# Patient Record
Sex: Female | Born: 1970 | Race: Black or African American | Hispanic: No | Marital: Single | Smoking: Never smoker
Health system: Southern US, Community
[De-identification: ages and names within clinical notes are randomized; demographics above are authoritative.]

## PROBLEM LIST (undated history)

## (undated) HISTORY — PX: ABDOMINAL HYSTERECTOMY: SHX81

---

## 2004-07-17 ENCOUNTER — Emergency Department: Payer: Self-pay | Admitting: Emergency Medicine

## 2004-09-08 ENCOUNTER — Emergency Department: Payer: Self-pay | Admitting: Emergency Medicine

## 2005-05-19 ENCOUNTER — Emergency Department: Payer: Self-pay | Admitting: Emergency Medicine

## 2006-02-25 ENCOUNTER — Emergency Department: Payer: Self-pay | Admitting: General Practice

## 2006-06-21 ENCOUNTER — Emergency Department: Payer: Self-pay | Admitting: Emergency Medicine

## 2006-06-21 ENCOUNTER — Other Ambulatory Visit: Payer: Self-pay

## 2006-09-28 ENCOUNTER — Emergency Department: Payer: Self-pay

## 2006-12-21 ENCOUNTER — Emergency Department: Payer: Self-pay | Admitting: Emergency Medicine

## 2007-09-30 ENCOUNTER — Emergency Department: Payer: Self-pay | Admitting: Emergency Medicine

## 2008-02-19 ENCOUNTER — Emergency Department: Payer: Self-pay

## 2008-04-10 ENCOUNTER — Emergency Department: Payer: Self-pay | Admitting: Emergency Medicine

## 2008-04-11 ENCOUNTER — Emergency Department: Payer: Self-pay | Admitting: Emergency Medicine

## 2011-11-16 ENCOUNTER — Observation Stay: Payer: Self-pay | Admitting: Obstetrics and Gynecology

## 2011-11-16 LAB — COMPREHENSIVE METABOLIC PANEL
Albumin: 3.4 g/dL (ref 3.4–5.0)
Alkaline Phosphatase: 57 U/L (ref 50–136)
BUN: 11 mg/dL (ref 7–18)
Calcium, Total: 8.7 mg/dL (ref 8.5–10.1)
Chloride: 106 mmol/L (ref 98–107)
Co2: 22 mmol/L (ref 21–32)
Creatinine: 0.7 mg/dL (ref 0.60–1.30)
EGFR (African American): >60
EGFR (Non-African Amer.): >60
Osmolality: 279 (ref 275–301)
SGPT (ALT): 18 U/L
Sodium: 140 mmol/L (ref 136–145)

## 2011-11-16 LAB — URINALYSIS, COMPLETE
Bilirubin,UR: NEGATIVE
Glucose,UR: NEGATIVE mg/dL (ref 0–75)
Ketone: NEGATIVE
Ph: 5 (ref 4.5–8.0)
RBC,UR: 11 /HPF (ref 0–5)
Specific Gravity: 1.021 (ref 1.003–1.030)
Squamous Epithelial: 13
WBC UR: 308 /HPF (ref 0–5)

## 2011-11-16 LAB — CBC
HCT: 24.1 % — ABNORMAL LOW (ref 35.0–47.0)
HGB: 7.4 g/dL — ABNORMAL LOW (ref 12.0–16.0)
MCH: 21.4 pg — ABNORMAL LOW (ref 26.0–34.0)
MCV: 69 fL — ABNORMAL LOW (ref 80–100)
Platelet: 281 10*3/uL (ref 150–440)
RDW: 21.3 % — ABNORMAL HIGH (ref 11.5–14.5)
WBC: 7.1 10*3/uL (ref 3.6–11.0)

## 2011-11-16 LAB — CK TOTAL AND CKMB (NOT AT ARMC)
CK, Total: 110 U/L (ref 21–215)
CK-MB: 0.7 ng/mL (ref 0.5–3.6)

## 2011-11-16 LAB — HCG, QUANTITATIVE, PREGNANCY: Beta Hcg, Quant.: <1 m[IU]/mL — ABNORMAL LOW

## 2011-11-16 LAB — WET PREP, GENITAL

## 2011-11-17 LAB — CBC WITH DIFFERENTIAL/PLATELET
Basophil #: 0 10*3/uL (ref 0.0–0.1)
HGB: 7.1 g/dL — ABNORMAL LOW (ref 12.0–16.0)
Lymphocyte %: 21.8 %
MCH: 20.8 pg — ABNORMAL LOW (ref 26.0–34.0)
MCHC: 29.5 g/dL — ABNORMAL LOW (ref 32.0–36.0)
Monocyte #: 0.5 10*3/uL (ref 0.0–0.7)
Monocyte %: 9.4 %
Neutrophil #: 3.9 10*3/uL (ref 1.4–6.5)
Neutrophil %: 66 %
RBC: 3.43 10*6/uL — ABNORMAL LOW (ref 3.80–5.20)
RDW: 19.6 % — ABNORMAL HIGH (ref 11.5–14.5)
WBC: 5.8 10*3/uL (ref 3.6–11.0)

## 2011-11-18 LAB — URINE CULTURE

## 2013-01-05 ENCOUNTER — Emergency Department: Payer: Self-pay | Admitting: Emergency Medicine

## 2013-04-14 ENCOUNTER — Emergency Department: Payer: Self-pay | Admitting: Emergency Medicine

## 2013-04-14 LAB — COMPREHENSIVE METABOLIC PANEL
Albumin: 3.6 g/dL (ref 3.4–5.0)
Alkaline Phosphatase: 86 U/L (ref 50–136)
Anion Gap: 3 — ABNORMAL LOW (ref 7–16)
BUN: 11 mg/dL (ref 7–18)
Bilirubin,Total: 0.5 mg/dL (ref 0.2–1.0)
Calcium, Total: 9.1 mg/dL (ref 8.5–10.1)
Co2: 28 mmol/L (ref 21–32)
EGFR (African American): >60
EGFR (Non-African Amer.): >60
Glucose: 97 mg/dL (ref 65–99)
Osmolality: 277 (ref 275–301)
SGOT(AST): 20 U/L (ref 15–37)
SGPT (ALT): 23 U/L (ref 12–78)
Sodium: 139 mmol/L (ref 136–145)
Total Protein: 8 g/dL (ref 6.4–8.2)

## 2013-04-14 LAB — URINALYSIS, COMPLETE
Ph: 5 (ref 4.5–8.0)
Protein: 30
RBC,UR: 34 /HPF (ref 0–5)
Specific Gravity: 1.032 (ref 1.003–1.030)
WBC UR: 30 /HPF (ref 0–5)

## 2013-04-14 LAB — CBC
HCT: 40.6 % (ref 35.0–47.0)
HGB: 13.7 g/dL (ref 12.0–16.0)
MCH: 30.1 pg (ref 26.0–34.0)
MCHC: 33.8 g/dL (ref 32.0–36.0)
MCV: 89 fL (ref 80–100)
RBC: 4.57 10*6/uL (ref 3.80–5.20)
RDW: 13.4 % (ref 11.5–14.5)
WBC: 9.3 10*3/uL (ref 3.6–11.0)

## 2013-04-14 LAB — LIPASE, BLOOD: Lipase: 103 U/L (ref 73–393)

## 2013-04-17 LAB — URINE CULTURE

## 2013-09-12 ENCOUNTER — Observation Stay: Payer: Self-pay | Admitting: Internal Medicine

## 2013-09-12 LAB — URINALYSIS, COMPLETE
Bilirubin,UR: NEGATIVE
Glucose,UR: NEGATIVE mg/dL (ref 0–75)
Ketone: NEGATIVE
NITRITE: NEGATIVE
Ph: 7 (ref 4.5–8.0)
Protein: NEGATIVE
RBC,UR: 15 /HPF (ref 0–5)
SPECIFIC GRAVITY: 1.013 (ref 1.003–1.030)

## 2013-09-12 LAB — COMPREHENSIVE METABOLIC PANEL
ALK PHOS: 71 U/L
AST: 19 U/L (ref 15–37)
Albumin: 3.3 g/dL — ABNORMAL LOW (ref 3.4–5.0)
Anion Gap: 3 — ABNORMAL LOW (ref 7–16)
BUN: 8 mg/dL (ref 7–18)
Bilirubin,Total: 0.5 mg/dL (ref 0.2–1.0)
Calcium, Total: 8.9 mg/dL (ref 8.5–10.1)
Chloride: 106 mmol/L (ref 98–107)
Co2: 26 mmol/L (ref 21–32)
Creatinine: 0.68 mg/dL (ref 0.60–1.30)
EGFR (African American): >60
EGFR (Non-African Amer.): >60
GLUCOSE: 100 mg/dL — AB (ref 65–99)
Osmolality: 269 (ref 275–301)
Potassium: 3.6 mmol/L (ref 3.5–5.1)
SGPT (ALT): 20 U/L (ref 12–78)
SODIUM: 135 mmol/L — AB (ref 136–145)
Total Protein: 7.7 g/dL (ref 6.4–8.2)

## 2013-09-12 LAB — TROPONIN I: Troponin-I: <0.02 ng/mL

## 2013-09-12 LAB — CBC
HCT: 38.5 % (ref 35.0–47.0)
HGB: 13 g/dL (ref 12.0–16.0)
MCH: 30.3 pg (ref 26.0–34.0)
MCHC: 33.7 g/dL (ref 32.0–36.0)
MCV: 90 fL (ref 80–100)
PLATELETS: 233 10*3/uL (ref 150–440)
RBC: 4.29 10*6/uL (ref 3.80–5.20)
RDW: 13.9 % (ref 11.5–14.5)
WBC: 8 10*3/uL (ref 3.6–11.0)

## 2013-09-12 LAB — CK TOTAL AND CKMB (NOT AT ARMC)
CK, TOTAL: 86 U/L (ref 21–215)
CK, TOTAL: 76 U/L (ref 21–215)
CK, Total: 71 U/L (ref 21–215)
CK-MB: 0.6 ng/mL (ref 0.5–3.6)
CK-MB: <0.5 ng/mL — ABNORMAL LOW (ref 0.5–3.6)

## 2013-09-13 LAB — CBC WITH DIFFERENTIAL/PLATELET
BASOS ABS: 0 10*3/uL (ref 0.0–0.1)
BASOS PCT: 0.4 %
EOS ABS: 0.1 10*3/uL (ref 0.0–0.7)
Eosinophil %: 1.4 %
HCT: 36.4 % (ref 35.0–47.0)
HGB: 12.2 g/dL (ref 12.0–16.0)
LYMPHS PCT: 28.9 %
Lymphocyte #: 2.1 10*3/uL (ref 1.0–3.6)
MCH: 30.1 pg (ref 26.0–34.0)
MCHC: 33.5 g/dL (ref 32.0–36.0)
MCV: 90 fL (ref 80–100)
Monocyte #: 0.7 x10 3/mm (ref 0.2–0.9)
Monocyte %: 9.7 %
NEUTROS PCT: 59.6 %
Neutrophil #: 4.3 10*3/uL (ref 1.4–6.5)
PLATELETS: 207 10*3/uL (ref 150–440)
RBC: 4.05 10*6/uL (ref 3.80–5.20)
RDW: 13.7 % (ref 11.5–14.5)
WBC: 7.2 10*3/uL (ref 3.6–11.0)

## 2013-09-13 LAB — LIPID PANEL
Cholesterol: 132 mg/dL (ref 0–200)
HDL Cholesterol: 58 mg/dL (ref 40–60)
Ldl Cholesterol, Calc: 60 mg/dL (ref 0–100)
Triglycerides: 70 mg/dL (ref 0–200)
VLDL Cholesterol, Calc: 14 mg/dL (ref 5–40)

## 2013-09-13 LAB — BASIC METABOLIC PANEL
Anion Gap: 4 — ABNORMAL LOW (ref 7–16)
BUN: 12 mg/dL (ref 7–18)
CALCIUM: 8.9 mg/dL (ref 8.5–10.1)
Chloride: 108 mmol/L — ABNORMAL HIGH (ref 98–107)
Co2: 27 mmol/L (ref 21–32)
Creatinine: 0.72 mg/dL (ref 0.60–1.30)
EGFR (African American): >60
GLUCOSE: 93 mg/dL (ref 65–99)
OSMOLALITY: 277 (ref 275–301)
POTASSIUM: 3.9 mmol/L (ref 3.5–5.1)
SODIUM: 139 mmol/L (ref 136–145)

## 2013-09-13 LAB — TSH: Thyroid Stimulating Horm: 1.22 u[IU]/mL

## 2013-09-13 LAB — MAGNESIUM: Magnesium: 1.9 mg/dL

## 2013-09-13 LAB — HEMOGLOBIN A1C: Hemoglobin A1C: 6.1 % (ref 4.2–6.3)

## 2014-02-12 ENCOUNTER — Emergency Department: Payer: Self-pay | Admitting: Emergency Medicine

## 2014-02-12 LAB — URINALYSIS, COMPLETE
BILIRUBIN, UR: NEGATIVE
Glucose,UR: NEGATIVE mg/dL (ref 0–75)
KETONE: NEGATIVE
Nitrite: POSITIVE
Ph: 5 (ref 4.5–8.0)
Protein: NEGATIVE
RBC,UR: 14 /HPF (ref 0–5)
SPECIFIC GRAVITY: 1.021 (ref 1.003–1.030)
Squamous Epithelial: 7
WBC UR: 23 /HPF (ref 0–5)

## 2014-02-12 LAB — CBC WITH DIFFERENTIAL/PLATELET
Basophil #: 0.1 10*3/uL (ref 0.0–0.1)
Basophil %: 0.6 %
EOS ABS: 0.1 10*3/uL (ref 0.0–0.7)
EOS PCT: 1 %
HCT: 39.8 % (ref 35.0–47.0)
HGB: 13.3 g/dL (ref 12.0–16.0)
Lymphocyte #: 2.1 10*3/uL (ref 1.0–3.6)
Lymphocyte %: 21.6 %
MCH: 30.4 pg (ref 26.0–34.0)
MCHC: 33.5 g/dL (ref 32.0–36.0)
MCV: 91 fL (ref 80–100)
Monocyte #: 0.9 x10 3/mm (ref 0.2–0.9)
Monocyte %: 9.6 %
NEUTROS ABS: 6.4 10*3/uL (ref 1.4–6.5)
Neutrophil %: 67.2 %
PLATELETS: 221 10*3/uL (ref 150–440)
RBC: 4.39 10*6/uL (ref 3.80–5.20)
RDW: 13.6 % (ref 11.5–14.5)
WBC: 9.5 10*3/uL (ref 3.6–11.0)

## 2014-02-12 LAB — COMPREHENSIVE METABOLIC PANEL
ALK PHOS: 81 U/L
Albumin: 3.4 g/dL (ref 3.4–5.0)
Anion Gap: 9 (ref 7–16)
BUN: 10 mg/dL (ref 7–18)
Bilirubin,Total: 0.3 mg/dL (ref 0.2–1.0)
CREATININE: 0.73 mg/dL (ref 0.60–1.30)
Calcium, Total: 9.2 mg/dL (ref 8.5–10.1)
Chloride: 103 mmol/L (ref 98–107)
Co2: 25 mmol/L (ref 21–32)
EGFR (Non-African Amer.): >60
Glucose: 112 mg/dL — ABNORMAL HIGH (ref 65–99)
OSMOLALITY: 274 (ref 275–301)
Potassium: 3.7 mmol/L (ref 3.5–5.1)
SGOT(AST): 15 U/L (ref 15–37)
SGPT (ALT): 16 U/L (ref 12–78)
Sodium: 137 mmol/L (ref 136–145)
TOTAL PROTEIN: 7.6 g/dL (ref 6.4–8.2)

## 2014-02-14 LAB — URINE CULTURE

## 2014-06-21 ENCOUNTER — Emergency Department: Payer: Self-pay | Admitting: Emergency Medicine

## 2014-07-02 ENCOUNTER — Emergency Department: Payer: Self-pay | Admitting: Emergency Medicine

## 2014-12-20 NOTE — Discharge Summary (Signed)
PATIENT NAME:  Brooke Bishop, Brooke Bishop MR#:  [id number] DATE OF BIRTH:  April 10, 1971  DATE OF ADMISSION:  09/12/2013 DATE OF DISCHARGE:  09/13/2013  ADMISSION DIAGNOSIS: Chest pain.   DISCHARGE DIAGNOSES:  1.  Chest pain.  2.  Asthma, which is stable.  3.  Obesity.   CONSULTATIONS: None.   IMAGING: The patient had a Myoview stress test, which essentially showed no evidence of ischemia.   Troponins are negative.   HOSPITAL COURSE:  This is a very pleasant 44 year old female who presented with chest pain. For further details, please refer to the H and P.  1.  Chest pain: The patient was admitted to telemetry. Her cardiac enzymes were cycled, all of which were negative. She underwent a stress test, which essentially was negative. I suspect her chest pain is actually secondary to a cold with chest congestion that she has had. She has no symptoms of asthma. She was chest pain-free during her hospitalization.  2.  History of asthma: This seems to be stable at this time. No wheezing. The patient is not requiring oxygen.  3.  Obesity: We encourage weight loss as tolerated.   DISCHARGE MEDICATIONS: None.   DISCHARGE ACTIVITY: As tolerated.   DISCHARGE FOLLOWUP: The patient will follow up with her MD.   TIME SPENT: Approximately 35 minutes. The patient is medically stable for discharge.    ____________________________ Abdallah Hern P. Juliene PinaMody, MD spm:jcm D: 09/13/2013 14:02:55 ET T: 09/13/2013 18:06:41 ET JOB#: [id number]  cc: Canisha Issac P. Juliene PinaMody, MD, <Dictator> Janyth ContesSITAL P Tylyn Stankovich MD ELECTRONICALLY SIGNED 09/13/2013 21:56

## 2014-12-20 NOTE — H&P (Signed)
PATIENT NAME:  Brooke Bishop, Brooke Bishop MR#:  [id number] DATE OF BIRTH:  February 14, 1971  DATE OF ADMISSION:  09/12/2013  REFERRING PHYSICIAN: Kandee Keenory R. York CeriseForbach, MD  PRIMARY CARE PHYSICIAN: None.   CHIEF COMPLAINT: Chest pain.   HISTORY OF PRESENT ILLNESS: The patient is a pleasant, obese 44 year old African American female with history of well-controlled asthma, on no medications, who comes in for chest pain. Initially, the pain started on Saturday. It woke her from sleep and was substernal. The pain persisted off and on and then went away. The patient was at work and about 9:00 today had another episode of chest pain associated with numbness and tingling in the fingers, some pain in the left shoulder and the left arm. Again, the pain came and went every couple of minutes and now is more persistent. She felt a little short of breath with the pain as well. She came into the hospital where she was noted to have a negative troponin x 1. She has received 4 baby aspirins. She had no acute ST changes on an EKG but given her history, she will be admitted to rule out acute coronary syndrome, and medicine was consulted for further management and evaluation.   PAST MEDICAL HISTORY: Well-controlled asthma, history of C-section, cholecystectomy, and [facility]orrhagia [facility].   ALLERGIES: No known drug allergies.   FAMILY HISTORY: Hypertension, diabetes, and fibroids. Mom had breast cancer. Dad with lung cancer.   SOCIAL HISTORY: No tobacco. Occasional alcohol. No drug use. Works in [employer].   OUTPATIENT MEDICATIONS: None.  REVIEW OF SYSTEMS:    CONSTITUTIONAL: No fever, fatigue, weakness or weight changes.  EYES: No blurry vision or double vision.  EARS, NOSE, THROAT: No tinnitus or hearing loss. Had cold symptoms about 3 weeks ago, which are resolved.  RESPIRATORY: Mild shortness of breath earlier, now resolved. No shortness of breath now. No painful respirations.  CARDIOVASCULAR:  Chest pain as above. No swelling in the legs. No arrhythmia or CHF. No heart attacks in the past. No palpitations.  GASTROINTESTINAL: No nausea, vomiting, diarrhea, abdominal pain, bloody stools or dark stools.  GENITOURINARY: Denies dysuria or hematuria. Has had a history of UTIs in the past but currently, although she has a dirty urinalysis, denies having any increased frequency, dysuria or hematuria.  HEMATOLOGIC AND LYMPHATIC: Denies anemia or easy bruising. Had a history of anemia in the past.  SKIN: No rashes.  MUSCULOSKELETAL: Denies arthritis or gout.  NEUROLOGIC: Denies focal weakness or numbness.  PSYCHIATRIC: Denies anxiety or depression.   PHYSICAL EXAMINATION: VITAL SIGNS: Temperature on arrival 97.4, pulse rate 86, respiratory rate 17, blood pressure 142/76, O2 sat 100% on oxygen.  GENERAL: The patient is an obese pleasant female, sitting in bed, no obvious distress, talking in full sentences.  HEENT: Normocephalic, atraumatic. Pupils are equal and reactive. Moist mucous membranes.  NECK: Supple. No thyroid tenderness. No cervical lymphadenopathy. No JVD.  CARDIOVASCULAR: S1, S2, irregularly irregular. No murmurs, rubs or gallops.  LUNGS: Clear to auscultation without wheezing, rhonchi or rales.  ABDOMEN: Soft, nontender, obese. Positive bowel sounds in all quadrants.  EXTREMITIES: No pitting edema.  SKIN: No obvious rashes. NEUROLOGIC: Cranial nerves II though XII appear to be grossly intact. Strength 5 out of 5 in all extremities.  PSYCHIATRIC: Awake, alert, oriented x 3, pleasant.   LABORATORY, DIAGNOSTIC AND RADIOLOGICAL DATA: Glucose 100, BUN 8, creatinine 0.68, sodium 135, potassium 3.8, albumin of 3.3, otherwise within normal limits. Troponin negative. CBC within normal limits  with hemoglobin of 13 and platelets of 233. Urinalysis: 2+ blood, 1+ leukocyte esterase, 10 WBC, 3+ bacteria; there are 24 epithelial cells.   EKG: Normal sinus rhythm with sinus arrhythmia. No  acute ST elevations or depressions. Rate 79.   X-ray of the chest: Negative for acute cardiopulmonary disease.   ASSESSMENT AND PLAN: We have a 44 year old female with history of well-controlled asthma and significant menorrhagia, status post cholecystectomy, who presents with chest pain. She has a negative troponin and no significant EKG changes, but given her story, would bring her in for observation and rule out acute coronary syndrome. Would start her on a low-dose beta blocker, nitroglycerin patch, morphine p.r.n., and aspirin starting tomorrow as she already has had aspirin. Would obtain an echocardiogram and rule out acute coronary syndrome with cyclic cardiac markers and risk stratify the patient and check TSH, hemoglobin A1c, and a lipid profile. If she does rule out for acute coronary syndrome, we would obtain a stress test for tomorrow. She has no acute ST changes on the EKG at this point. Would start her on heparin for deep vein thrombosis prophylaxis. Although she has a positive urinalysis, I doubt she is having a urinary tract infection. She did have a dose of ceftriaxone in the ER, which I would not continue as she is asymptomatic and does have a significant amount of epithelial cells, hinting of possible  contaminant specimen. The patient is full code.   TOTAL TIME SPENT: 40 minutes.    ____________________________ Krystal Eaton, MD sa:jcm D: 09/12/2013 14:15:39 ET T: 09/12/2013 14:32:10 ET JOB#: [id number]  cc: Krystal Eaton, MD, <Dictator> Krystal Eaton MD ELECTRONICALLY SIGNED 10/12/2013 10:43

## 2014-12-21 NOTE — H&P (Signed)
    Subjective/Chief Complaint 44 yo who presents to ER with dizziness, she started her menses and it was heavier than normal and she got dizzy with bleeding. She was seen in the ER where her HR was 104 and her HCT was 24.1, US done by the ER was found to show masses in the uterus most likely fibroids.    History of Present Illness pt has had heavy periods for two years and very regular. she has not had a pap in over 13 years and has had 2 csections. she has a fam hx of fibroids. The pt has HTN in the ER and has a fam hx but has not been in to see a doctor so she does not know if she has htn or not. She also has POS nitrites on urine dip and has a UTI    Past History No abnl pap ever. 5 miscarriages and 2 csections. she is not a smoker,   Past Med/Surgical Hx:  Asthma:   Cesarean Section:   Cholecystectomy:   ALLERGIES:  No Known Allergies:   Family and Social History:   Family History Hypertension  Diabetes Mellitus  Cancer  Fibroids    Social History negative tobacco, negative ETOH    Place of Living Home   Review of Systems:   Fever/Chills No    Cough No    Sputum No    Abdominal Pain No    Diarrhea No    Constipation No    Nausea/Vomiting No    SOB/DOE No    Chest Pain No    Dysuria Yes    Medications/Allergies Reviewed Medications/Allergies reviewed   Physical Exam:   GEN WD, obese    HEENT moist oral mucosa    RESP normal resp effort    CARD regular rate    ABD denies tenderness  normal BS    NEURO cranial nerves intact    PSYCH alert, lethargic    Additional Comments pt has tried to get medicaid but a few years ago did not qualify, she is in need of a pcp I will get house doc to come monitor her BP and I will start her on provera top et the bleeding to stop.     Assessment/Admission Diagnosis fibroids and no heALTHcare. needs to be seen by pcp as she has htn.she has uti    Plan I will start IV abx and provera PO and get the house docs to see  her for tx of her HTN.   Electronic Signatures: Adria DevonKlett, Germain Koopmann (MD)  (Signed 20-Mar-13 21:09)  Authored: CHIEF COMPLAINT and HISTORY, PAST MEDICAL/SURGIAL HISTORY, ALLERGIES, FAMILY AND SOCIAL HISTORY, REVIEW OF SYSTEMS, PHYSICAL EXAM, ASSESSMENT AND PLAN   Last Updated: 20-Mar-13 21:09 by Adria DevonKlett, Steele Ledonne (MD)

## 2015-04-27 ENCOUNTER — Emergency Department
Admission: EM | Admit: 2015-04-27 | Discharge: 2015-04-27 | Disposition: A | Discharge status: Home / Self Care | Payer: Self-pay | Attending: Student | Admitting: Student

## 2015-04-27 ENCOUNTER — Encounter: Payer: Self-pay | Admitting: Emergency Medicine

## 2015-04-27 DIAGNOSIS — L089 Local infection of the skin and subcutaneous tissue, unspecified: Secondary | ICD-10-CM | POA: Insufficient documentation

## 2015-04-27 DIAGNOSIS — B9689 Other specified bacterial agents as the cause of diseases classified elsewhere: Secondary | ICD-10-CM

## 2015-04-27 MED ORDER — NAPROXEN 500 MG PO TABS: 500.0000 mg | ORAL_TABLET | Freq: Two times a day (BID) | ORAL | Status: DC

## 2015-04-27 MED ORDER — SULFAMETHOXAZOLE-TRIMETHOPRIM 800-160 MG PO TABS: 1.0000 | ORAL_TABLET | Freq: Two times a day (BID) | ORAL | Status: DC

## 2015-04-27 NOTE — ED Provider Notes (Signed)
[facility] Emergency Department Provider Note  ____________________________________________  Time seen: Approximately 4:09 PM  I have reviewed the triage vital signs and the nursing notes.   HISTORY  Chief Complaint Recurrent Skin Infections    HPI Brooke Bishop is a 44 y.o. female patient complaining of papular lesions on her right breast. Patient stated this been a bug to 3 days. Patient denies any discharge from the areas. Patient rating the pain as a 7/10. She denies any fever or chills. No palliative measures taken for this complaint.   History reviewed. No pertinent past medical history.  There are no active problems to display for this patient.   Past Surgical History  Procedure Laterality Date  . Abdominal hysterectomy      No current outpatient prescriptions on file.  Allergies Review of patient's allergies indicates no known allergies.  No family history on file.  Social History Social History  Substance Use Topics  . Smoking status: Never Smoker   . Smokeless tobacco: None  . Alcohol Use: No    Review of Systems Constitutional: No fever/chills Eyes: No visual changes. ENT: No sore throat. Cardiovascular: Denies chest pain. Respiratory: Denies shortness of breath. Gastrointestinal: No abdominal pain.  No nausea, no vomiting.  No diarrhea.  No constipation. Genitourinary: Negative for dysuria. Musculoskeletal: Negative for back pain. Skin: Skin infection Neurological: Negative for headaches, focal weakness or numbness. Allergic/Immunilogical:  10-point ROS otherwise negative.  ____________________________________________   PHYSICAL EXAM:  VITAL SIGNS: ED Triage Vitals  Enc Vitals Group     BP 04/27/15 1544 116/87 mmHg     Pulse Rate 04/27/15 1544 89     Resp 04/27/15 1544 18     Temp 04/27/15 1544 98.5 F (36.9 C)     Temp Source 04/27/15 1544 Oral     SpO2 04/27/15 1544 100 %     Weight 04/27/15 1544 195 lb  (88.451 kg)     Height 04/27/15 1544 5\' 5"  (1.651 m)     Head Cir --      Peak Flow --      Pain Score 04/27/15 1545 7     Pain Loc --      Pain Edu? --      Excl. in GC? --     Constitutional: Alert and oriented. Well appearing and in no acute distress. Eyes: Conjunctivae are normal. PERRL. EOMI. Head: Atraumatic. Nose: No congestion/rhinnorhea. Mouth/Throat: Mucous membranes are moist.  Oropharynx non-erythematous. Neck: No stridor. No cervical spine tenderness to palpation. Hematological/Lymphatic/Immunilogical: No cervical lymphadenopathy. Cardiovascular: Normal rate, regular rhythm. Grossly normal heart sounds.  Good peripheral circulation. Respiratory: Normal respiratory effort.  No retractions. Lungs CTAB. Gastrointestinal: Soft and nontender. No distention. No abdominal bruits. No CVA tenderness. Musculoskeletal: No lower extremity tenderness nor edema.  No joint effusions. Neurologic:  Normal speech and language. No gross focal neurologic deficits are appreciated. No gait instability. Skin:  Skin is warm, dry and intact. No rash noted. Psychiatric: Mood and affect are normal. Speech and behavior are normal.  ____________________________________________   LABS (all labs ordered are listed, but only abnormal results are displayed)  Labs Reviewed - No data to display ____________________________________________  EKG   ____________________________________________  RADIOLOGY   ____________________________________________   PROCEDURES  Procedure(s) performed: None  Critical Care performed: No  ____________________________________________   INITIAL IMPRESSION / ASSESSMENT AND PLAN / ED COURSE  Pertinent labs & imaging results that were available during my care of the patient were reviewed by me and  considered in my medical decision making (see chart for details).  Skin infection right breast. Advised patient supportive home care. Patient was given a  prescription for Bactrim DS to take as directed. Patient is advised to [facility]. ____________________________________________   FINAL CLINICAL IMPRESSION(S) / ED DIAGNOSES  Final diagnoses:  Bacterial skin infection      Joni Reining, PA-C 04/27/15 1618  Gayla Doss, MD 04/27/15 [phone]

## 2015-04-27 NOTE — ED Notes (Signed)
Pt states she has 2 abscesses under right breast that are painful

## 2015-08-18 ENCOUNTER — Emergency Department
Admission: EM | Admit: 2015-08-18 | Discharge: 2015-08-18 | Disposition: A | Discharge status: Home / Self Care | Payer: No Typology Code available for payment source | Attending: Emergency Medicine | Admitting: Emergency Medicine

## 2015-08-18 ENCOUNTER — Encounter: Payer: Self-pay | Admitting: Medical Oncology

## 2015-08-18 DIAGNOSIS — R51 Headache: Secondary | ICD-10-CM | POA: Diagnosis present

## 2015-08-18 DIAGNOSIS — G44209 Tension-type headache, unspecified, not intractable: Secondary | ICD-10-CM | POA: Diagnosis not present

## 2015-08-18 MED ORDER — KETOROLAC TROMETHAMINE 30 MG/ML IJ SOLN
30.0000 mg | Freq: Once | INTRAMUSCULAR | Status: AC
  Administered 2015-08-18: 30 mg via INTRAMUSCULAR
  Filled 2015-08-18: qty 1

## 2015-08-18 MED ORDER — SUMATRIPTAN SUCCINATE 50 MG PO TABS: ORAL_TABLET | ORAL | Status: DC

## 2015-08-18 NOTE — Discharge Instructions (Signed)
Tension Headache A tension headache is pain, pressure, or aching that is felt over the front and sides of your head. These headaches can last from 30 minutes to several days. HOME CARE Managing Pain  Take over-the-counter and prescription medicines only as told by your doctor.  Lie down in a dark, quiet room when you have a headache.  If directed, apply ice to your head and neck area:  Put ice in a plastic bag.  Place a towel between your skin and the bag.  Leave the ice on for 20 minutes, 2-3 times per day.  Use a heating pad or a hot shower to apply heat to your head and neck area as told by your doctor. Eating and Drinking  Eat meals on a regular schedule.  Do not drink a lot of alcohol.  Do not use a lot of caffeine, or stop using caffeine. General Instructions  Keep all follow-up visits as told by your doctor. This is important.  Keep a journal to find out if certain things bring on headaches. For example, write down:  What you eat and drink.  How much sleep you get.  Any change to your diet or medicines.  Try getting a massage, or doing other things that help you to relax.  Lessen stress.  Sit up straight. Do not tighten (tense) your muscles.  Do not use tobacco products. This includes cigarettes, chewing tobacco, or e-cigarettes. If you need help quitting, ask your doctor.  Exercise regularly as told by your doctor.  Get enough sleep. This may mean 7-9 hours of sleep. GET HELP IF:  Your symptoms are not helped by medicine.  You have a headache that feels different from your usual headache.  You feel sick to your stomach (nauseous) or you throw up (vomit).  You have a fever. GET HELP RIGHT AWAY IF:  Your headache becomes very bad.  You keep throwing up.  You have a stiff neck.  You have trouble seeing.  You have trouble speaking.  You have pain in your eye or ear.  Your muscles are weak or you lose muscle control.  You lose your balance  or you have trouble walking.  You feel like you will pass out (faint) or you pass out.  You have confusion.   This information is not intended to replace advice given to you by your health care provider. Make sure you discuss any questions you have with your health care provider.   Document Released: 11/09/2009 Document Revised: 05/06/2015 Document Reviewed: 12/08/2014 Elsevier Interactive Patient Education 2016 [employer]  

## 2015-08-18 NOTE — ED Notes (Signed)
Pt reports migraine headache x 1 week with nausea and vomiting. Pt reports hx years ago of migraines.

## 2015-08-18 NOTE — ED Provider Notes (Signed)
CSN: [id number]     Arrival date & time 08/18/15  0909 History   First MD Initiated Contact with Patient 08/18/15 1032     Chief Complaint  Patient presents with  . Headache    HPI Comments: 44 year old female presents today complaining of "migraine" headache over the past week. Headache affects bilateral frontal region and she also has pain in her neck. It came on gradually and she has had transient relief with OTC anti-inflammatories. Has had nausea with vomiting. Has not taken anything for the headache today. She is requesting a work note for this evening. Has a history of migraines with similar symptoms.   The history is provided by the patient.    History reviewed. No pertinent past medical history. Past Surgical History  Procedure Laterality Date  . Abdominal hysterectomy     No family history on file. Social History  Substance Use Topics  . Smoking status: Never Smoker   . Smokeless tobacco: None  . Alcohol Use: No   OB History    No data available     Review of Systems  Eyes: Negative for photophobia.  Gastrointestinal: Positive for nausea and vomiting.  Neurological: Positive for headaches. Negative for dizziness.  All other systems reviewed and are negative.     Allergies  Percocet  Home Medications   Prior to Admission medications   Medication Sig Start Date End Date Taking? Authorizing Provider  SUMAtriptan (IMITREX) 50 MG tablet May repeat in 2 hours if headache persists or recurs. Max of 4 pills in one day. 08/18/15   Suan Halter V, PA-C   BP 123/62 mmHg  Pulse 74  Temp(Src) 97.7 F (36.5 C) (Oral)  Resp 18  Ht  (1.651 m)  Wt 86.183 kg  BMI 31.62 kg/m2  SpO2 99% Physical Exam  Constitutional: She is oriented to person, place, and time. Vital signs are normal. She appears well-developed and well-nourished. She is active.  Non-toxic appearance. She does not have a sickly appearance. She does not appear ill.  HENT:  Head: Normocephalic and  atraumatic.  Right Ear: Hearing, tympanic membrane, external ear and ear canal normal.  Left Ear: Hearing, tympanic membrane, external ear and ear canal normal.  Nose: Nose normal.  Mouth/Throat: Uvula is midline, oropharynx is clear and moist and mucous membranes are normal.  Eyes: Conjunctivae and EOM are normal. Pupils are equal, round, and reactive to light.  No photophobia  Neck: Normal range of motion and full passive range of motion without pain. Neck supple. No Brudzinski's sign and no Kernig's sign noted.  Lymphadenopathy:    She has no cervical adenopathy.  Neurological: She is alert and oriented to person, place, and time. She has normal strength. No cranial nerve deficit or sensory deficit. She displays a negative Romberg sign. Coordination and gait normal. GCS eye subscore is 4. GCS verbal subscore is 5. GCS motor subscore is 6.  Skin: Skin is warm and dry.  Psychiatric: She has a normal mood and affect. Her behavior is normal. Judgment and thought content normal.  Nursing note and vitals reviewed.   ED Course  Procedures (including critical care time) Labs Review Labs Reviewed - No data to display  Imaging Review No results found. I have personally reviewed and evaluated these images and lab results as part of my medical decision-making.   EKG Interpretation None      MDM  Toradol  IM given in ER with good relief. No red flag warning symptoms  warranting imaging Imitrex 50mg  given to take as needed for headaches. Establish with a PCP for further care of ongoing headaches. Return for new or worsening symptoms  Final diagnoses:  Tension headache        Christella Scheuermannmma Mileah Hemmer V, PA-C 08/18/15 1215  Emily FilbertJonathan E Williams, MD 08/18/15 [phone]

## 2015-10-13 ENCOUNTER — Emergency Department: Payer: Self-pay

## 2015-10-13 ENCOUNTER — Encounter: Payer: Self-pay | Admitting: Emergency Medicine

## 2015-10-13 ENCOUNTER — Emergency Department
Admission: EM | Admit: 2015-10-13 | Discharge: 2015-10-13 | Disposition: A | Discharge status: Home / Self Care | Payer: Self-pay | Attending: Emergency Medicine | Admitting: Emergency Medicine

## 2015-10-13 DIAGNOSIS — Z79899 Other long term (current) drug therapy: Secondary | ICD-10-CM | POA: Insufficient documentation

## 2015-10-13 DIAGNOSIS — R1110 Vomiting, unspecified: Secondary | ICD-10-CM | POA: Insufficient documentation

## 2015-10-13 DIAGNOSIS — E669 Obesity, unspecified: Secondary | ICD-10-CM | POA: Insufficient documentation

## 2015-10-13 DIAGNOSIS — R1012 Left upper quadrant pain: Secondary | ICD-10-CM | POA: Insufficient documentation

## 2015-10-13 LAB — CBC WITH DIFFERENTIAL/PLATELET
BASOS PCT: 0 %
Basophils Absolute: 0 10*3/uL (ref 0–0.1)
EOS ABS: 0.1 10*3/uL (ref 0–0.7)
EOS PCT: 1 %
HCT: 39.9 % (ref 35.0–47.0)
Hemoglobin: 13.3 g/dL (ref 12.0–16.0)
LYMPHS ABS: 1.7 10*3/uL (ref 1.0–3.6)
Lymphocytes Relative: 28 %
MCH: 29.9 pg (ref 26.0–34.0)
MCHC: 33.2 g/dL (ref 32.0–36.0)
MCV: 90.1 fL (ref 80.0–100.0)
MONOS PCT: 10 %
Monocytes Absolute: 0.6 10*3/uL (ref 0.2–0.9)
Neutro Abs: 3.5 10*3/uL (ref 1.4–6.5)
Neutrophils Relative %: 61 %
PLATELETS: 194 10*3/uL (ref 150–440)
RBC: 4.43 MIL/uL (ref 3.80–5.20)
RDW: 13.8 % (ref 11.5–14.5)
WBC: 5.9 10*3/uL (ref 3.6–11.0)

## 2015-10-13 LAB — URINALYSIS COMPLETE WITH MICROSCOPIC (ARMC ONLY)
BILIRUBIN URINE: NEGATIVE
GLUCOSE, UA: NEGATIVE mg/dL
KETONES UR: NEGATIVE mg/dL
Leukocytes, UA: NEGATIVE
NITRITE: NEGATIVE
PROTEIN: NEGATIVE mg/dL
Specific Gravity, Urine: 1.012 (ref 1.005–1.030)
pH: 6 (ref 5.0–8.0)

## 2015-10-13 LAB — TROPONIN I: Troponin I: <0.03 ng/mL (ref <0.031)

## 2015-10-13 LAB — COMPREHENSIVE METABOLIC PANEL
ALBUMIN: 3.9 g/dL (ref 3.5–5.0)
ALK PHOS: 56 U/L (ref 38–126)
ALT: 16 U/L (ref 14–54)
AST: 20 U/L (ref 15–41)
Anion gap: 6 (ref 5–15)
BILIRUBIN TOTAL: 1 mg/dL (ref 0.3–1.2)
BUN: 10 mg/dL (ref 6–20)
CALCIUM: 9.1 mg/dL (ref 8.9–10.3)
CO2: 25 mmol/L (ref 22–32)
CREATININE: 0.65 mg/dL (ref 0.44–1.00)
Chloride: 107 mmol/L (ref 101–111)
GFR calc non Af Amer: >60 mL/min (ref >60)
GLUCOSE: 87 mg/dL (ref 65–99)
Potassium: 3.6 mmol/L (ref 3.5–5.1)
SODIUM: 138 mmol/L (ref 135–145)
TOTAL PROTEIN: 7.8 g/dL (ref 6.5–8.1)

## 2015-10-13 LAB — LIPASE, BLOOD: Lipase: 19 U/L (ref 11–51)

## 2015-10-13 MED ORDER — ONDANSETRON HCL 4 MG PO TABS: 4.0000 mg | ORAL_TABLET | Freq: Every day | ORAL | Status: DC | PRN

## 2015-10-13 MED ORDER — SODIUM CHLORIDE 0.9 % IV BOLUS (SEPSIS)
1000.0000 mL | Freq: Once | INTRAVENOUS | Status: AC
  Administered 2015-10-13: 1000 mL via INTRAVENOUS

## 2015-10-13 MED ORDER — BARIUM SULFATE 2.1 % PO SUSP
450.0000 mL | ORAL | Status: AC
  Administered 2015-10-13 (×2): 450 mL via ORAL

## 2015-10-13 MED ORDER — PROMETHAZINE HCL 25 MG/ML IJ SOLN
INTRAMUSCULAR | Status: AC
  Administered 2015-10-13: 12.5 mg via INTRAVENOUS
  Filled 2015-10-13: qty 1

## 2015-10-13 MED ORDER — FENTANYL CITRATE (PF) 100 MCG/2ML IJ SOLN
50.0000 ug | Freq: Once | INTRAMUSCULAR | Status: AC
  Administered 2015-10-13: 50 ug via INTRAVENOUS
  Filled 2015-10-13: qty 2

## 2015-10-13 MED ORDER — ONDANSETRON 8 MG PO TBDP
8.0000 mg | ORAL_TABLET | Freq: Once | ORAL | Status: AC
  Administered 2015-10-13: 8 mg via ORAL
  Filled 2015-10-13: qty 1

## 2015-10-13 MED ORDER — PROMETHAZINE HCL 25 MG/ML IJ SOLN
12.5000 mg | Freq: Once | INTRAMUSCULAR | Status: AC
  Administered 2015-10-13: 12.5 mg via INTRAVENOUS

## 2015-10-13 NOTE — Discharge Instructions (Signed)

## 2015-10-13 NOTE — ED Provider Notes (Addendum)
[facility] Emergency Department Provider Note  ____________________________________________   I have reviewed the triage vital signs and the nursing notes.   HISTORY  Chief Complaint Abdominal Pain    HPI Brooke Bishop is a 44 y.o. female who has a history of a hysterectomy in the past, as well as migraines, otherwise no abdominal surgeries or significant medical problems presents today complaining of left-sided abdominal pain and vomiting. Began gradually last night. Denies any fever or chills. Has not had any diarrhea. No chest pain or shortness of breath. Pain is reproducible in the abdominal cavity itself. Nothing makes the pain better nothing makes it worse. She denies any hematemesis or bright red blood per rectum. She did vomit 2. Has not had this pain before. Does not seem any worse with food. Denies headache or cough at this time. She denies flank pain or dysuria.  History reviewed. No pertinent past medical history.  There are no active problems to display for this patient.   Past Surgical History  Procedure Laterality Date  . Abdominal hysterectomy      Current Outpatient Rx  Name  Route  Sig  Dispense  Refill  . SUMAtriptan (IMITREX) 50 MG tablet      May repeat in 2 hours if headache persists or recurs. Max of 4 pills in one day.   4 tablet   0     Allergies Shellfish allergy and Percocet  No family history on file.  Social History Social History  Substance Use Topics  . Smoking status: Never Smoker   . Smokeless tobacco: None  . Alcohol Use: No    Review of Systems Constitutional: No fever/chills Eyes: No visual changes. ENT: No sore throat. No stiff neck no neck pain Cardiovascular: Denies chest pain. Respiratory: Denies shortness of breath. Gastrointestinal:   + vomiting.  No diarrhea.  No constipation. Genitourinary: Negative for dysuria. Musculoskeletal: Negative lower extremity swelling Skin: Negative for  rash. Neurological: Negative for headaches, focal weakness or numbness. 10-point ROS otherwise negative.  ____________________________________________   PHYSICAL EXAM:  VITAL SIGNS: ED Triage Vitals  Enc Vitals Group     BP 10/13/15 0912 118/65 mmHg     Pulse Rate 10/13/15 0912 83     Resp 10/13/15 0910 18     Temp 10/13/15 0910 98.2 F (36.8 C)     Temp Source 10/13/15 0910 Oral     SpO2 10/13/15 0912 100 %     Weight 10/13/15 0910 195 lb (88.451 kg)     Height 10/13/15 0910  (1.651 m)     Head Cir --      Peak Flow --      Pain Score 10/13/15 0911 8     Pain Loc --      Pain Edu? --      Excl. in GC? --     Constitutional: Alert and oriented. Well appearing and in no acute distress. Eyes: Conjunctivae are normal. PERRL. EOMI. Head: Atraumatic. Nose: No congestion/rhinnorhea. Mouth/Throat: Mucous membranes are moist.  Oropharynx non-erythematous. Neck: No stridor.   Nontender with no meningismus Cardiovascular: Normal rate, regular rhythm. Grossly normal heart sounds.  Good peripheral circulation. Respiratory: Normal respiratory effort.  No retractions. Lungs CTAB. Abdominal: Obesity noted, there is left mid to upper abdominal discomfort. There is voluntary guarding but no rebound. Palpation does reproduce the patient's discomfort. Her abdomen is soft. Back:  There is no focal tenderness or step off there is no midline tenderness there are  no lesions noted. there is no CVA tenderness Musculoskeletal: No lower extremity tenderness. No joint effusions, no DVT signs strong distal pulses no edema Neurologic:  Normal speech and language. No gross focal neurologic deficits are appreciated.  Skin:  Skin is warm, dry and intact. No rash noted. Psychiatric: Mood and affect are normal. Speech and behavior are normal.  ____________________________________________   LABS (all labs ordered are listed, but only abnormal results are displayed)  Labs Reviewed  COMPREHENSIVE  METABOLIC PANEL  LIPASE, BLOOD  CBC WITH DIFFERENTIAL/PLATELET  URINALYSIS COMPLETEWITH MICROSCOPIC ([facility] ONLY)   ____________________________________________  EKG  I personally interpreted any EKGs ordered by me or triage Normal sinus rhythm rate 59 bpm no acute ST elevation or acute ST depression normal axis unremarkable EKG ____________________________________________  RADIOLOGY  I reviewed any imaging ordered by me or triage that were performed during my shift ____________________________________________   PROCEDURES  Procedure(s) performed: None  Critical Care performed: None  ____________________________________________   INITIAL IMPRESSION / ASSESSMENT AND PLAN / ED COURSE  Pertinent labs & imaging results that were available during my care of the patient were reviewed by me and considered in my medical decision making (see chart for details).  Patient with very reproducible abdominal pain, consistent with possible diverticular other pathologies. We will obtain imaging, blood work, we'll give the patient IV pain medication and reassess. She states Percocet makes her vomit that she has no other allergic reaction to it. She is also allergic to shellfish we'll discuss with radiology there safety protocols for IV contrast. Given her adiposity hopefully we can avoid IV contrast as needed. Oral may be of use. No evidence of this is referred cardiac discomfort. Patient is very reproducible abdominal pain.  ----------------------------------------- 1:51 PM on 10/13/2015 -----------------------------------------  Patient with air in her bladder. Did discuss with on-call urology. MacDiarmid  They recommend outpatient follow-up. They do not recommend empiric treatment. There is no evidence of fistula there is no evidence of ongoing kidney stone there is no evidence of hydronephrosis there is no evidence of UTI on her urine but they do advise following a culture which we will do.  As a precaution we also obtain an EKG given her left-sided pain units is quite reproducible abdominal pain. EKG is normal without any evidence of acute ischemia in any event. And troponin is negative too. Patient is requesting discharge and a work note at this time we will discharge her with outpatient follow-up.     ____________________________________________   FINAL CLINICAL IMPRESSION(S) / ED DIAGNOSES  Final diagnoses:  None      This chart was dictated using voice recognition software.  Despite best efforts to proofread,  errors can occur which can change meaning.     Jeanmarie Plant, MD 10/13/15 1610  Jeanmarie Plant, MD 10/13/15 1352  Jeanmarie Plant, MD 10/13/15 [phone]

## 2015-10-13 NOTE — ED Notes (Signed)
Discussed discharge instructions, prescriptions, and follow-up care with patient. No questions or concerns at this time. Pt stable at discharge.  

## 2015-10-13 NOTE — ED Notes (Signed)
Pt here with c/o LUQ pain and vomiting that began yesterday. Appears in no distress.

## 2015-10-13 NOTE — ED Notes (Addendum)
Pt has completed oral contrast. CT notified.

## 2015-10-13 NOTE — ED Notes (Signed)
Patient transported to CT 

## 2015-10-13 NOTE — ED Notes (Signed)
Pt returned from CT °

## 2015-10-16 LAB — URINE CULTURE: Culture: 60000

## 2016-06-03 ENCOUNTER — Emergency Department
Admission: EM | Admit: 2016-06-03 | Discharge: 2016-06-03 | Disposition: A | Discharge status: Home / Self Care | Payer: No Typology Code available for payment source | Attending: Emergency Medicine | Admitting: Emergency Medicine

## 2016-06-03 ENCOUNTER — Encounter: Payer: Self-pay | Admitting: Emergency Medicine

## 2016-06-03 DIAGNOSIS — H1032 Unspecified acute conjunctivitis, left eye: Secondary | ICD-10-CM

## 2016-06-03 MED ORDER — OFLOXACIN 0.3 % OP SOLN: 2.0000 [drp] | Freq: Four times a day (QID) | OPHTHALMIC | 0 refills | Status: AC

## 2016-06-03 NOTE — ED Triage Notes (Signed)
States she developed left eye swelling and irritation yesterday  Had drainage to left eye

## 2016-06-03 NOTE — ED Provider Notes (Signed)
[facility] Emergency Department Provider Note  ____________________________________________  Time seen: Approximately 10:15 AM  I have reviewed the triage vital signs and the nursing notes.   HISTORY  Chief Complaint Eye Drainage    HPI Brooke Bishop is a 45 y.o. female who presents with redness and drainage from her left eye x2 days. Watery discharge from left eye and eye redness began yesterday, this morning when she woke up she noticed that her lashes were matted shut and the eyelids were a little swollen as well. Denies blurred vision, eye pain, itching, trauma to the eye. Patient does not wear contacts. States her kids all had pink eye about 2 weeks ago, but are better now. Denies fever, congestion, cough, sore throat.    History reviewed. No pertinent past medical history.  There are no active problems to display for this patient.   Past Surgical History:  Procedure Laterality Date  . ABDOMINAL HYSTERECTOMY      Prior to Admission medications   Medication Sig Start Date End Date Taking? Authorizing Provider  ofloxacin (OCUFLOX) 0.3 % ophthalmic solution Place 2 drops into the left eye 4 (four) times daily. 06/03/16 06/10/16  Christiane HaJonathan D Kendon Sedeno, PA-C  ondansetron (ZOFRAN) 4 MG tablet Take 1 tablet (4 mg total) by mouth daily as needed for nausea or vomiting. 10/13/15   Jeanmarie PlantJames A McShane, MD  SUMAtriptan (IMITREX) 50 MG tablet May repeat in 2 hours if headache persists or recurs. Max of 4 pills in one day. 08/18/15   Christella ScheuermannEmma V Lawrence, PA-C    Allergies Shellfish allergy and Percocet [oxycodone-acetaminophen]  No family history on file.  Social History Social History  Substance Use Topics  . Smoking status: Never Smoker  . Smokeless tobacco: Never Used  . Alcohol use No     Review of Systems  Constitutional: No fever/chills Eyes: No visual changes. No discharge ENT: No upper respiratory complaints. Respiratory: no cough.  Neurological:  Negative for headaches.  ____________________________________________   PHYSICAL EXAM:  VITAL SIGNS: ED Triage Vitals  Enc Vitals Group     BP 06/03/16 0935 122/67     Pulse Rate 06/03/16 0935 75     Resp 06/03/16 0935 16     Temp 06/03/16 0935 98.1 F (36.7 C)     Temp src --      SpO2 06/03/16 0935 99 %     Weight 06/03/16 0936 198 lb (89.8 kg)     Height 06/03/16 0936 5\' 5"  (1.651 m)     Head Circumference --      Peak Flow --      Pain Score 06/03/16 0948 2     Pain Loc --      Pain Edu? --      Excl. in GC? --      Constitutional: Alert and oriented. Well appearing and in no acute distress. Eyes: Left conjunctiva injected and mildly edematous, right conjunctiva normal. Left sclera injected, no icterus bilaterally. Clear drainage noted to left eye.  PERRL. EOMI. Head: Atraumatic. ENT:      Nose: No congestion/rhinnorhea.      Mouth/Throat: Mucous membranes are moist. Oropharynx non-erythematous and without exudate. Uvula is midline and non-edematous.  Neck: No stridor. Supple, full ROM without pain or difficulty.  Hematological/Lymphatic/Immunilogical: No cervical lymphadenopathy. Cardiovascular: Normal rate, regular rhythm. Normal S1 and S2.  Good peripheral circulation. Respiratory: Normal respiratory effort without tachypnea or retractions.  Musculoskeletal: Full range of motion to all extremities. No gross deformities appreciated. Neurologic:  Normal speech and language. No gross focal neurologic deficits are appreciated.  Skin:  Skin is warm, dry and intact. No rash noted. Psychiatric: Mood and affect are normal. Speech and behavior are normal. Patient exhibits appropriate insight and judgement.   ____________________________________________   LABS (all labs ordered are listed, but only abnormal results are displayed)  Labs Reviewed - No data to  display ____________________________________________  EKG  None. ____________________________________________  RADIOLOGY  No results found.  ____________________________________________    PROCEDURES  Procedure(s) performed:    Procedures    Medications - No data to display   ____________________________________________   INITIAL IMPRESSION / ASSESSMENT AND PLAN / ED COURSE  Pertinent labs & imaging results that were available during my care of the patient were reviewed by me and considered in my medical decision making (see chart for details).  Review of the [place] CSRS was performed in accordance of the [facility] prior to dispensing any controlled drugs.  Clinical Course    Patient's diagnosis is consistent with conjunctivitis. Patient will be discharged home with prescriptions for ofloxacin opthalmic drops. Patient is to follow up with ophthalmology as needed or otherwise directed. Patient is given ED precautions to return to the ED for any worsening or new symptoms. No other emergency medicine complaints at this time.     ____________________________________________  FINAL CLINICAL IMPRESSION(S) / ED DIAGNOSES  Final diagnoses:  Acute conjunctivitis of left eye, unspecified acute conjunctivitis type      NEW MEDICATIONS STARTED DURING THIS VISIT:  New Prescriptions   OFLOXACIN (OCUFLOX) 0.3 % OPHTHALMIC SOLUTION    Place 2 drops into the left eye 4 (four) times daily.        This chart was dictated using voice recognition software/Dragon. Despite best efforts to proofread, errors can occur which can change the meaning. Any change was purely unintentional.   Racheal Patches, PA-C 06/03/16 1040    Governor Rooks, MD 06/03/16 1359

## 2016-06-05 ENCOUNTER — Emergency Department
Admission: EM | Admit: 2016-06-05 | Discharge: 2016-06-05 | Disposition: A | Discharge status: Home / Self Care | Payer: No Typology Code available for payment source | Attending: Emergency Medicine | Admitting: Emergency Medicine

## 2016-06-05 ENCOUNTER — Encounter: Payer: Self-pay | Admitting: Emergency Medicine

## 2016-06-05 DIAGNOSIS — H1032 Unspecified acute conjunctivitis, left eye: Secondary | ICD-10-CM | POA: Insufficient documentation

## 2016-06-05 MED ORDER — ACETAMINOPHEN-CODEINE #3 300-30 MG PO TABS: 1.0000 | ORAL_TABLET | Freq: Four times a day (QID) | ORAL | 0 refills | Status: DC | PRN

## 2016-06-05 MED ORDER — TETRACAINE HCL 0.5 % OP SOLN
1.0000 [drp] | Freq: Once | OPHTHALMIC | Status: AC
  Administered 2016-06-05: 2 [drp] via OPHTHALMIC
  Filled 2016-06-05: qty 2

## 2016-06-05 MED ORDER — FLUORESCEIN SODIUM 1 MG OP STRP
1.0000 | ORAL_STRIP | Freq: Once | OPHTHALMIC | Status: AC
  Administered 2016-06-05: 1 via OPHTHALMIC
  Filled 2016-06-05: qty 1

## 2016-06-05 NOTE — ED Provider Notes (Signed)
[facility] Emergency Department Provider Note   ____________________________________________   First MD Initiated Contact with Patient 06/05/16 [phone]     (approximate)  I have reviewed the triage vital signs and the nursing notes.   HISTORY  Chief Complaint Conjunctivitis and Headache    HPI Brooke Bishop is a 45 y.o. female reports no significant medical history. Children in the family had "pinkeye" about a week ago and then on Thursday she began to develop itching and redness in her left eye with clear drainage. On Friday she started ciprofloxacin eyedrops which she has been using 4 times daily, she reports since then the left eye has become increasingly red, there is slight amount of swelling around the lids of the eye, and that the left eye has become moderately painful. Patient reports light makes pain worse. Patient also reports having a slight to moderate throbbing left-sided headache. No fevers or chills. No neck pain or stiffness.  Patient reports that she can see clearly out of the eye, there has been no decrease or fuzziness in her vision. Continues to experience drainage from the eye. She does not wear contacts. She has never had trouble with her eye before.  She has been taking Tylenol for headache at home which only provided mild relief.  The right eye has remained normal. She has not seen any redness on her face.  History reviewed. No pertinent past medical history.  There are no active problems to display for this patient.   Past Surgical History:  Procedure Laterality Date  . ABDOMINAL HYSTERECTOMY      Prior to Admission medications   Medication Sig Start Date End Date Taking? Authorizing Provider  acetaminophen-codeine (TYLENOL #3) 300-30 MG tablet Take 1 tablet by mouth every 6 (six) hours as needed for moderate pain. 06/05/16   Sharyn CreamerMark Quale, MD  ofloxacin (OCUFLOX) 0.3 % ophthalmic solution Place 2 drops into the left eye 4 (four)  times daily. 06/03/16 06/10/16  Christiane HaJonathan D Cuthriell, PA-C  ondansetron (ZOFRAN) 4 MG tablet Take 1 tablet (4 mg total) by mouth daily as needed for nausea or vomiting. 10/13/15   Jeanmarie PlantJames A McShane, MD  SUMAtriptan (IMITREX) 50 MG tablet May repeat in 2 hours if headache persists or recurs. Max of 4 pills in one day. 08/18/15   Christella ScheuermannEmma V Lawrence, PA-C    Allergies Shellfish allergy and Percocet [oxycodone-acetaminophen]  No family history on file.  Social History Social History  Substance Use Topics  . Smoking status: Never Smoker  . Smokeless tobacco: Never Used  . Alcohol use No    Review of Systems Constitutional: No fever/chills Eyes: No visual changes.See history of present illness. Patient denies any trauma to the eye. 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 ENT: No sore throat. No trouble breathing or swallowing. Cardiovascular: Denies chest pain. Respiratory: Denies shortness of breath. No cough Gastrointestinal: No abdominal pain.  No nausea, no vomiting.  No diarrhea.  No constipation. Musculoskeletal: Negative for neck pain. Skin: Negative for rash. Neurological: Negative for headaches, focal weakness or numbness.  10-point ROS otherwise negative.  ____________________________________________   PHYSICAL EXAM:  VITAL SIGNS: ED Triage Vitals  Enc Vitals Group     BP 06/05/16 0629 139/69     Pulse Rate 06/05/16 0629 80     Resp 06/05/16 0629 18     Temp 06/05/16 0629 98.1 F (36.7 C)     Temp Source 06/05/16 0629 Oral  SpO2 06/05/16 0629 96 %     Weight 06/05/16 0625 198 lb (89.8 kg)     Height 06/05/16 0625 5\' 5"  (1.651 m)     Head Circumference --      Peak Flow --      Pain Score 06/05/16 0625 10     Pain Loc --      Pain Edu? --      Excl. in GC? --     Constitutional: Alert and oriented. Well appearing and in no acute distress.Very pleasant. Lights off in the room. Eyes:   The right eye appears normal to visual exam.  The conjunctiva and sclera are normal. There is no pain in the right eye. No swelling or erythema surrounding. Normal right eye exam.  The left eye demonstrates mild edema of the eyelids, there is no periorbital swelling, there is no erythema overlying the orbit or evidence to support periorbital cellulitis or orbital cellulitis by clinical exam. The left eye conjunctiva is moderately injected, red, and slightly edematous. The pupil itself appears normal. The sclera is without jaundice or yellowing. The patient reports pain in the left eye with extraocular movements. Visual acuity in both eyes including the left isolated is normal.  Ophthalmologic exam under fluorescein staining with slit lamp: Demonstrates conjunctival inflammation, no flare cells, no hypopyon, there is no uptake of fluorescein in the conjunctiva or evidence of ulceration. No foreign body noted.  Intraocular pressures measure 14, 15 in the right eye. Intraocular pressure on the left measures 15, 16. Normal.  Pupils are equal round and reactive to light. Accommodation normal.  Head: Atraumatic. Nose: No congestion/rhinnorhea. Mouth/Throat: Mucous membranes are moist.  Oropharynx non-erythematous. Neck: No stridor.  No meningismus Cardiovascular: Normal rate, regular rhythm. Grossly normal heart sounds.  Good peripheral circulation. Respiratory: Normal respiratory effort.  No retractions. Lungs CTAB. Musculoskeletal: No lower extremity tenderness nor edema.  No joint effusions. Neurologic:  Normal speech and language. No gross focal neurologic deficits are appreciated. No gait instability. Normal cranial nerve exam. Skin:  Skin is warm, dry and intact. No rash noted. Psychiatric: Mood and affect are normal. Speech and behavior are normal.  ____________________________________________   LABS (all labs ordered are listed, but only abnormal results are displayed)  Labs Reviewed - No data to  display ____________________________________________  EKG   ____________________________________________  RADIOLOGY   ____________________________________________   PROCEDURES  Procedure(s) performed: None  Procedures  Critical Care performed: No  ____________________________________________   INITIAL IMPRESSION / ASSESSMENT AND PLAN / ED COURSE  Pertinent labs & imaging results that were available during my care of the patient were reviewed by me and considered in my medical decision making (see chart for details).  Patient presents with 3 days of painful left eyeball with conjunctival injection and mild edema of the eyelids. No systemic symptoms. No loss of vision. No foreign body sensations. Eye examination itself demonstrates primarily conjunctival injection without clear evidence of other concerning etiology. No neurologic deficits. No evidence of involvement of the optic nerve based on clinical examination and history.  This patient has been treated with 2 days of Cipro otic drops, but is not seeing improvement in symptoms I did discuss the case with Dr. Frederik Schmidt of ophthalmology and arranged follow-up with the patient tomorrow between 8 and 10 AM. Patient agreeable with this plan and will go to see ophthalmology tomorrow morning for follow-up. Ophthalmology recommends adding artificial tears to help with hydrating the eye, but no additional antibiotic.  Patient does report  she can take Tylenol 3, I will give her a prescription for this. She'll be followed closely by ophthalmology. We discussed specific return precautions including immediate return if she is to start noticing loss of vision, difficulty seeing out of the eye, stiff neck or severe pain in the left eye, increased redness or swelling, double vision, or other new concerns arise she'll return to emergency room right away.  I will prescribe the patient a narcotic pain medicine due to their condition which I  anticipate will cause at least moderate pain short term. I discussed with the patient safe use of narcotic pain medicines, and that they are not to drive, work in dangerous areas, or ever take more than prescribed (no more than 1 pill every 6 hours). We discussed that this is the type of medication that can be  overdosed on and the risks of this type of medicine. Patient is very agreeable to only use as prescribed and to never use more than prescribed.    Clinical Course     ____________________________________________   FINAL CLINICAL IMPRESSION(S) / ED DIAGNOSES  Final diagnoses:  Acute conjunctivitis of left eye, unspecified acute conjunctivitis type      NEW MEDICATIONS STARTED DURING THIS VISIT:  New Prescriptions   ACETAMINOPHEN-CODEINE (TYLENOL #3) 300-30 MG TABLET    Take 1 tablet by mouth every 6 (six) hours as needed for moderate pain.     Note:  This document was prepared using Dragon voice recognition software and may include unintentional dictation errors.     Sharyn Creamer, MD 06/05/16 [phone]

## 2016-06-05 NOTE — Discharge Instructions (Signed)
Follow-up with Dr. Frederik Schmidt. Use Refesh artificial tear drops every 1 to 2 hours.  Follow-up tomorrow anytime between 8-10AM with Dr. Frederik Schmidt tomorrow at her office. You may come in at anytime between 8 and 10 AM, otherise call in the morning for an appointment if this timefrane doesn't work for you.  You have an eye infection called conjunctivitis.  This can be caused by a viral or bacterial infection.  Please use the provided antibiotics or fill the provided prescription and use as directed.  Follow up as indicated on your instructions.  Return to the Emergency Department if your symptoms worsen in spite of treatment or if you develop new symptoms that concern you.

## 2016-06-05 NOTE — ED Triage Notes (Signed)
Patient states that she was seen here two days ago and diagnosed with conjunctivitis to left eye. Patient states that she was started on eye drops but it is not improving. Patient reports that she has a headache and pain to left side of her face.

## 2016-06-08 ENCOUNTER — Emergency Department
Admission: EM | Admit: 2016-06-08 | Discharge: 2016-06-08 | Disposition: A | Discharge status: Home / Self Care | Payer: No Typology Code available for payment source | Attending: Student in an Organized Health Care Education/Training Program | Admitting: Student in an Organized Health Care Education/Training Program

## 2016-06-08 ENCOUNTER — Encounter: Payer: Self-pay | Admitting: Emergency Medicine

## 2016-06-08 DIAGNOSIS — R0981 Nasal congestion: Secondary | ICD-10-CM | POA: Insufficient documentation

## 2016-06-08 DIAGNOSIS — B309 Viral conjunctivitis, unspecified: Secondary | ICD-10-CM | POA: Insufficient documentation

## 2016-06-08 DIAGNOSIS — R51 Headache: Secondary | ICD-10-CM | POA: Insufficient documentation

## 2016-06-08 MED ORDER — LORATADINE 10 MG PO TABS: 10.0000 mg | ORAL_TABLET | Freq: Every day | ORAL | 0 refills | Status: DC

## 2016-06-08 MED ORDER — DEXAMETHASONE 2 MG PO TABS: ORAL_TABLET | ORAL | 0 refills | Status: DC

## 2016-06-08 NOTE — ED Notes (Signed)
See triage note  Was seen on Monday for left eye swelling and irritation  Placed on ofloxacin drops  Then was seen by [facility] and told to stop theses drop and started on steroid drops  Swelling is increasing

## 2016-06-08 NOTE — ED Triage Notes (Signed)
Pt to ed with c/o left eye pain, redness and drainage.  Pt states recently treated for pink eye but eye is worse now.

## 2016-06-08 NOTE — ED Provider Notes (Signed)
[facility] Emergency Department Provider Note  ____________________________________________  Time seen: Approximately 10:00 AM  I have reviewed the triage vital signs and the nursing notes.   HISTORY  Chief Complaint Eye Drainage    HPI Brooke Bishop is a 45 y.o. female, NAD, presents to the emergency department for continued left eye redness, discharge and swelling.  She was seen in this ED 5 days ago, diagnosed with conjunctivitis and given antibiotic eye drops.  She returned to this ED 3 days ago with no improvement in symptoms and was referred to [facility].  Patient states she was seen at the [facility] 2 days ago, diagnosed with viral conjunctivitis and given steroid eye drops.  Patient reports taking the steroid drops as prescribed and applying warm compresses to the left eye without relief of symptoms. Patient reports worsening of left eyelid swelling.  She reports continued irritation of the left eye but that has improved in the last 2 days.  Patient reports clear discharge and redness of the left eye.  She denies any ocular discharge, irritation, redness, or swelling of the right eye.    She endorses history of seasonal allergies for which she occasionally takes Benadryl but is currently not taking anything for allergy symptoms.   She states that both her daughter and granddaughter had the same symptoms about two weeks ago that resolved in 1 week.  She works as a LawyerCNA and has a note from [facility] that she may return Monday. She is concerned that she won't be able to return if her symptoms persist.  Patient states that she has not scheduled an appointment for follow up with [facility] because she has not yet received her insurance card in the mail.  She also endorses rhinorrhea and mild headache, but denies visual changes, sore throat, coughing, ear pain, fevers, chills, nausea, or vomiting.  History reviewed. No pertinent past medical  history.  There are no active problems to display for this patient.   Past Surgical History:  Procedure Laterality Date  . ABDOMINAL HYSTERECTOMY      Prior to Admission medications   Medication Sig Start Date End Date Taking? Authorizing Provider  dexamethasone (DECADRON) 2 MG tablet Take 6 tablets on Day 1 with food, then decrease by 1 tablet daily until finished (6,5,4,3,2,1) 06/08/16   Jordany Russett L Kristianne Albin, PA-C  loratadine (CLARITIN) 10 MG tablet Take 1 tablet (10 mg total) by mouth daily. 06/08/16   Christie Viscomi L Giani Betzold, PA-C  ofloxacin (OCUFLOX) 0.3 % ophthalmic solution Place 2 drops into the left eye 4 (four) times daily. 06/03/16 06/10/16  Christiane HaJonathan D Cuthriell, PA-C    Allergies Shellfish allergy and Percocet [oxycodone-acetaminophen]  No family history on file.  Social History Social History  Substance Use Topics  . Smoking status: Never Smoker  . Smokeless tobacco: Never Used  . Alcohol use No     Review of Systems  Constitutional: No fever/chills Eyes: No visual changes. Clear ocular discharge from left eye.  Redness, irritation, eyelid swelling of the left eye.  No redness, drainage, pain, swelling about right eye ENT: Positive nasal drainage and congestion. No sore throat, ear pain, sinus pressure Cardiovascular: No chest pain. Respiratory: No cough.  Gastrointestinal: No nausea, vomiting.   Musculoskeletal: Negative for general myalgias.  Skin: Negative for rash, abnormal warmth. Neurological: Positive for headache but no focal weakness or numbness. 10-point ROS otherwise negative.  ____________________________________________   PHYSICAL EXAM:  VITAL SIGNS: ED Triage Vitals  Enc Vitals Group     BP 06/08/16 0907 128/66     Pulse Rate 06/08/16 0907 78     Resp 06/08/16 0907 16     Temp 06/08/16 0907 98.4 F (36.9 C)     Temp Source 06/08/16 0907 Oral     SpO2 06/08/16 0907 99 %     Weight 06/08/16 0907 202 lb (91.6 kg)     Height 06/08/16 0907 5\' 5"  (1.651  m)     Head Circumference --      Peak Flow --      Pain Score 06/08/16 0845 6     Pain Loc --      Pain Edu? --      Excl. in GC? --      Constitutional: Alert and oriented. Well appearing and in no acute distress. Eyes: Right conjunctiva is normalWithout icterus or injection. Left conjunctiva with moderate injection but no icterus.  Profuse clear ocular discharge from left eye.  Mild edema noted about left upper and lower eyelids.  PERRLA. EOMI without pain bilaterally.  Head: Atraumatic. ENT:      Nose: Mild congestion/rhinnorhea.      Mouth/Throat: Mucous membranes are moist. Tonsils without erythema, swelling or exudate. Clear postnasal drip. Mild cobblestoning noted. Neck: Supple with full range of motion Hematological/Lymphatic/Immunilogical: No cervical lymphadenopathy. Cardiovascular: Good peripheral circulation. Respiratory: Normal respiratory effort without tachypnea or retractions.  Neurologic:  Normal speech and language. No gross focal neurologic deficits are appreciated.  Skin:  Skin is warm, dry and intact. No rash, redness or abnormal warmth noted. Psychiatric: Mood and affect are normal. Speech and behavior are normal. Patient exhibits appropriate insight and judgement.   ____________________________________________   LABS  None ____________________________________________  EKG  None ____________________________________________  RADIOLOGY  None ____________________________________________    PROCEDURES  Procedure(s) performed: None   Procedures   Medications - No data to display   ____________________________________________   INITIAL IMPRESSION / ASSESSMENT AND PLAN / ED COURSE  Pertinent labs & imaging results that were available during my care of the patient were reviewed by me and considered in my medical decision making (see chart for details).  Clinical Course    Patient's diagnosis is consistent with viral conjunctivitis of  the left eye. Patient was educated on the course of viral conjunctivitis and when symptoms typically resolve.  Patient will be discharged home with prescriptions for Decadron Dosepak and ranitidine to take as directed.  Patient is to continue warm compresses 20 minutes 3-4 times daily. Patient is to follow up with [facility] if symptoms persist past this treatment course. Patient is given ED precautions to return to the ED for any worsening or new symptoms.    ____________________________________________  FINAL CLINICAL IMPRESSION(S) / ED DIAGNOSES  Final diagnoses:  Viral conjunctivitis of left eye      NEW MEDICATIONS STARTED DURING THIS VISIT:  Discharge Medication List as of 06/08/2016 10:18 AM    START taking these medications   Details  dexamethasone (DECADRON) 2 MG tablet Take 6 tablets on Day 1 with food, then decrease by 1 tablet daily until finished (6,5,4,3,2,1), Print    loratadine (CLARITIN) 10 MG tablet Take 1 tablet (10 mg total) by mouth daily., Starting Wed 06/08/2016, Print             Ernestene Kiel [employer], PA-C 06/08/16 1117    Willy Eddy, MD 06/08/16 [phone]

## 2016-12-06 ENCOUNTER — Encounter: Payer: Self-pay | Admitting: Emergency Medicine

## 2016-12-06 ENCOUNTER — Emergency Department: Payer: Self-pay

## 2016-12-06 ENCOUNTER — Emergency Department
Admission: EM | Admit: 2016-12-06 | Discharge: 2016-12-06 | Disposition: A | Discharge status: Home / Self Care | Payer: Self-pay | Attending: Emergency Medicine | Admitting: Emergency Medicine

## 2016-12-06 DIAGNOSIS — M7732 Calcaneal spur, left foot: Secondary | ICD-10-CM | POA: Insufficient documentation

## 2016-12-06 DIAGNOSIS — M722 Plantar fascial fibromatosis: Secondary | ICD-10-CM | POA: Insufficient documentation

## 2016-12-06 LAB — CBC WITH DIFFERENTIAL/PLATELET
BASOS ABS: 0 10*3/uL (ref 0–0.1)
BASOS PCT: 0 %
EOS ABS: 0.1 10*3/uL (ref 0–0.7)
EOS PCT: 1 %
HCT: 41.9 % (ref 35.0–47.0)
Hemoglobin: 13.9 g/dL (ref 12.0–16.0)
LYMPHS ABS: 1.7 10*3/uL (ref 1.0–3.6)
LYMPHS PCT: 22 %
MCH: 29.6 pg (ref 26.0–34.0)
MCHC: 33.1 g/dL (ref 32.0–36.0)
MCV: 89.4 fL (ref 80.0–100.0)
MONO ABS: 0.8 10*3/uL (ref 0.2–0.9)
MONOS PCT: 11 %
Neutro Abs: 5.2 10*3/uL (ref 1.4–6.5)
Neutrophils Relative %: 66 %
PLATELETS: 210 10*3/uL (ref 150–440)
RBC: 4.69 MIL/uL (ref 3.80–5.20)
RDW: 14 % (ref 11.5–14.5)
WBC: 7.8 10*3/uL (ref 3.6–11.0)

## 2016-12-06 LAB — URIC ACID: URIC ACID, SERUM: 5.8 mg/dL (ref 2.3–6.6)

## 2016-12-06 LAB — BASIC METABOLIC PANEL
ANION GAP: 7 (ref 5–15)
BUN: 11 mg/dL (ref 6–20)
CALCIUM: 9.6 mg/dL (ref 8.9–10.3)
CO2: 24 mmol/L (ref 22–32)
Chloride: 105 mmol/L (ref 101–111)
Creatinine, Ser: 0.7 mg/dL (ref 0.44–1.00)
GFR calc Af Amer: >60 mL/min (ref >60)
Glucose, Bld: 98 mg/dL (ref 65–99)
Potassium: 3.7 mmol/L (ref 3.5–5.1)
SODIUM: 136 mmol/L (ref 135–145)

## 2016-12-06 MED ORDER — NAPROXEN 500 MG PO TABS
500.0000 mg | ORAL_TABLET | Freq: Once | ORAL | Status: AC
  Administered 2016-12-06: 500 mg via ORAL
  Filled 2016-12-06: qty 1

## 2016-12-06 MED ORDER — NAPROXEN 500 MG PO TABS: 500.0000 mg | ORAL_TABLET | Freq: Two times a day (BID) | ORAL | Status: DC

## 2016-12-06 NOTE — ED Triage Notes (Signed)
Pain in both foot.  Now the left one is worse, but she is on her feet a lot at work.

## 2016-12-06 NOTE — ED Notes (Signed)
Pt states left foot pain for several weeks without relief, states she believes it might be gout but denies any hx, pt ambulatory

## 2016-12-06 NOTE — ED Provider Notes (Signed)
[facility] Emergency Department Provider Note   ____________________________________________   First MD Initiated Contact with Patient 12/06/16 1107     (approximate)  I have reviewed the triage vital signs and the nursing notes.   HISTORY  Chief Complaint Foot Pain    HPI Brooke Bishop is a 46 y.o. female patient complaining of bilateral foot pain left being greater than right that has increased the last week. Patient admits to prolonged standing on her feet. Patient also states pain the left was seen confined to the left toe. No other provocative incident for her complaint.  History reviewed. No pertinent past medical history.  There are no active problems to display for this patient.   Past Surgical History:  Procedure Laterality Date  . ABDOMINAL HYSTERECTOMY      Prior to Admission medications   Medication Sig Start Date End Date Taking? Authorizing Provider  dexamethasone (DECADRON) 2 MG tablet Take 6 tablets on Day 1 with food, then decrease by 1 tablet daily until finished (6,5,4,3,2,1) 06/08/16   Jami L Hagler, PA-C  loratadine (CLARITIN) 10 MG tablet Take 1 tablet (10 mg total) by mouth daily. 06/08/16   Jami L Hagler, PA-C  naproxen (NAPROSYN) 500 MG tablet Take 1 tablet (500 mg total) by mouth 2 (two) times daily with a meal. 12/06/16   Joni Reining, PA-C    Allergies Shellfish allergy and Percocet [oxycodone-acetaminophen]  No family history on file.  Social History Social History  Substance Use Topics  . Smoking status: Never Smoker  . Smokeless tobacco: Never Used  . Alcohol use No    Review of Systems Constitutional: No fever/chills Eyes: No visual changes. ENT: No sore throat. Cardiovascular: Denies chest pain. Respiratory: Denies shortness of breath. Gastrointestinal: No abdominal pain.  No nausea, no vomiting.  No diarrhea.  No constipation. Genitourinary: Negative for dysuria. Musculoskeletal: Negative for back  pain. Skin: Negative for rash. Neurological: Negative for headaches, focal weakness or numbness.    ____________________________________________   PHYSICAL EXAM:  VITAL SIGNS: ED Triage Vitals  Enc Vitals Group     BP 12/06/16 1049 (!) 145/74     Pulse Rate 12/06/16 1049 93     Resp 12/06/16 1049 16     Temp 12/06/16 1049 97.9 F (36.6 C)     Temp Source 12/06/16 1049 Oral     SpO2 12/06/16 1049 99 %     Weight 12/06/16 1049 220 lb (99.8 kg)     Height 12/06/16 1049  (1.651 m)     Head Circumference --      Peak Flow --      Pain Score 12/06/16 1048 10     Pain Loc --      Pain Edu? --      Excl. in GC? --     Constitutional: Alert and oriented. Well appearing and in no acute distress.Morbid obesity Eyes: Conjunctivae are normal. PERRL. EOMI. Head: Atraumatic. Nose: No congestion/rhinnorhea. Mouth/Throat: Mucous membranes are moist.  Oropharynx non-erythematous. Neck: No stridor.  No cervical spine tenderness to palpation. Hematological/Lymphatic/Immunilogical: No cervical lymphadenopathy. Cardiovascular: Normal rate, regular rhythm. Grossly normal heart sounds.  Good peripheral circulation. Respiratory: Normal respiratory effort.  No retractions. Lungs CTAB. Gastrointestinal: Soft and nontender. No distention. No abdominal bruits. No CVA tenderness. Musculoskeletal: Obvious deformity of bilateral foot. Patient has mild edema dose aspect bilateral foot. Patient has moderate guarding palpation of the right hallux.  Neurologic:  Normal speech and language. No gross focal neurologic  deficits are appreciated. No gait instability. Skin:  Skin is warm, dry and intact. No rash noted. Psychiatric: Mood and affect are normal. Speech and behavior are normal.  ____________________________________________   LABS (all labs ordered are listed, but only abnormal results are displayed)  Labs Reviewed  CBC WITH DIFFERENTIAL/PLATELET  BASIC METABOLIC PANEL  URIC ACID    ____________________________________________  EKG   ____________________________________________  RADIOLOGY  X-rays unremarkable consent for heel spur. ____________________________________________   PROCEDURES  Procedure(s) performed: None  Procedures  Critical Care performed: No  ____________________________________________   INITIAL IMPRESSION / ASSESSMENT AND PLAN / ED COURSE  Pertinent labs & imaging results that were available during my care of the patient were reviewed by me and considered in my medical decision making (see chart for details).  Plantar fasciitis with heel spur.      ____________________________________________   FINAL CLINICAL IMPRESSION(S) / ED DIAGNOSES  Final diagnoses:  Plantar fasciitis of left foot  Heel spur, left  Patient given discharge care instructions. Patient given a work no. Patient given a prescription for naproxen. Patient advised follow-up with podiatry for definitive evaluation and treatment.    NEW MEDICATIONS STARTED DURING THIS VISIT:  New Prescriptions   NAPROXEN (NAPROSYN) 500 MG TABLET    Take 1 tablet (500 mg total) by mouth 2 (two) times daily with a meal.     Note:  This document was prepared using Dragon voice recognition software and may include unintentional dictation errorsJoni Reining K Johann Santone, PA-C 12/06/16 1219    Emily Filbert, MD 12/06/16 1256

## 2017-02-05 ENCOUNTER — Emergency Department: Payer: Worker's Compensation

## 2017-02-05 ENCOUNTER — Emergency Department
Admission: EM | Admit: 2017-02-05 | Discharge: 2017-02-05 | Disposition: A | Discharge status: Home / Self Care | Payer: Worker's Compensation | Attending: Emergency Medicine | Admitting: Emergency Medicine

## 2017-02-05 ENCOUNTER — Encounter: Payer: Self-pay | Admitting: Emergency Medicine

## 2017-02-05 DIAGNOSIS — Y929 Unspecified place or not applicable: Secondary | ICD-10-CM | POA: Diagnosis not present

## 2017-02-05 DIAGNOSIS — Y999 Unspecified external cause status: Secondary | ICD-10-CM | POA: Diagnosis not present

## 2017-02-05 DIAGNOSIS — W010XXA Fall on same level from slipping, tripping and stumbling without subsequent striking against object, initial encounter: Secondary | ICD-10-CM | POA: Insufficient documentation

## 2017-02-05 DIAGNOSIS — Y939 Activity, unspecified: Secondary | ICD-10-CM | POA: Insufficient documentation

## 2017-02-05 DIAGNOSIS — S52592A Other fractures of lower end of left radius, initial encounter for closed fracture: Secondary | ICD-10-CM

## 2017-02-05 DIAGNOSIS — S52502A Unspecified fracture of the lower end of left radius, initial encounter for closed fracture: Secondary | ICD-10-CM | POA: Diagnosis not present

## 2017-02-05 DIAGNOSIS — S59912A Unspecified injury of left forearm, initial encounter: Secondary | ICD-10-CM | POA: Diagnosis present

## 2017-02-05 MED ORDER — ACETAMINOPHEN-CODEINE #3 300-30 MG PO TABS: 1.0000 | ORAL_TABLET | ORAL | 0 refills | Status: AC | PRN

## 2017-02-05 MED ORDER — TRAMADOL HCL 50 MG PO TABS
50.0000 mg | ORAL_TABLET | Freq: Once | ORAL | Status: DC
  Filled 2017-02-05: qty 1

## 2017-02-05 NOTE — ED Notes (Signed)

## 2017-02-05 NOTE — Discharge Instructions (Signed)
Call Dr. Samuel GermanyKrasinski's office tomorrow to make an appointment. Leave splint on until seen by the orthopedist. Ice and elevation to decrease swelling and help with pain. Tylenol 3 one every 4 hours if needed for pain. Do not take this medication on an empty stomach. Do not drive or operate machinery while taking this medication. You may also take ibuprofen as needed for pain and inflammation.

## 2017-02-05 NOTE — ED Provider Notes (Signed)
[facility] Emergency Department Provider Note   ____________________________________________   First MD Initiated Contact with Patient 02/05/17 1118     (approximate)  I have reviewed the triage vital signs and the nursing notes.   HISTORY  Chief Complaint Arm Pain    HPI Brooke Bishop is a 46 y.o. female is here with complaint of left wrist pain. Patient states that she was walking down a grassy hill looking for a resident when she slipped and fell. She states her arm went behind her while landing on her outstretched hand. She denies any history of head injury or loss of consciousness. She denies any nausea or vomiting. There is been no vision changes. This is a Designer, [employer]. injury. Currently she rates her pain as a 10 over 10.   History reviewed. No pertinent past medical history.  There are no active problems to display for this patient.   Past Surgical History:  Procedure Laterality Date  . ABDOMINAL HYSTERECTOMY      Prior to Admission medications   Medication Sig Start Date End Date Taking? Authorizing Provider  acetaminophen-codeine (TYLENOL #3) 300-30 MG tablet Take 1 tablet by mouth every 4 (four) hours as needed for moderate pain. 02/05/17   Tommi RumpsSummers, Rhonda L, PA-C    Allergies Shellfish allergy and Percocet [oxycodone-acetaminophen]  No family history on file.  Social History Social History  Substance Use Topics  . Smoking status: Never Smoker  . Smokeless tobacco: Never Used  . Alcohol use No    Review of Systems  Constitutional: No fever/chills Eyes: No visual changes. ENT: No trauma Cardiovascular: Denies chest pain. Respiratory: Denies shortness of breath. Gastrointestinal: No abdominal pain.  No nausea, no vomiting.  Musculoskeletal: Negative for back pain. Positive for left wrist pain. Skin: Negative for rash. Neurological: Negative for headaches, focal weakness or  numbness.   ____________________________________________   PHYSICAL EXAM:  VITAL SIGNS: ED Triage Vitals  Enc Vitals Group     BP 02/05/17 1052 (!) 153/79     Pulse Rate 02/05/17 1052 98     Resp 02/05/17 1052 18     Temp 02/05/17 1052 98.7 F (37.1 C)     Temp Source 02/05/17 1052 Oral     SpO2 02/05/17 1052 97 %     Weight 02/05/17 1052 220 lb (99.8 kg)     Height 02/05/17 1052 5\' 5"  (1.651 m)     Head Circumference --      Peak Flow --      Pain Score 02/05/17 1054 10     Pain Loc --      Pain Edu? --      Excl. in GC? --     Constitutional: Alert and oriented. Well appearing and in no acute distress. Eyes: Conjunctivae are normal. PERRL. EOMI. Head: Atraumatic. Neck: No stridor.   Cardiovascular: Normal rate, regular rhythm. Grossly normal heart sounds.  Good peripheral circulation. Respiratory: Normal respiratory effort.  No retractions. Lungs CTAB. Musculoskeletal: There is moderate soft tissue swelling of the left wrist with decreased range of motion. Marked tenderness on palpation radial greater than ulnar aspect. Skin is intact. There is no ecchymosis or abrasions seen. Digits move without any difficulty. Motor sensory function intact. Pulses present. Nontender to palpation remainder of the left upper extremity. Neurologic:  Normal speech and language. No gross focal neurologic deficits are appreciated. No gait instability. Skin:  Skin is warm, dry and intact. No rash noted. Psychiatric: Mood and affect are normal. Speech  and behavior are normal.  ____________________________________________   LABS (all labs ordered are listed, but only abnormal results are displayed)  Labs Reviewed - No data to display  RADIOLOGY  Dg Forearm Left  Result Date: 02/05/2017 CLINICAL DATA:  46 year old female status post fall down [place] with left forearm pain and swelling. EXAM: LEFT FOREARM - 2 VIEW COMPARISON:  None. FINDINGS: Comminuted and mildly impacted distal right radius  fracture. No significant dorsal angulation, but there is suspected radiocarpal joint involvement. Lesser DRU involvement. The distal left ulna appears intact. Carpal bone alignment appears preserved. Underlying normal bone mineralization. Left radius and ulna shafts are intact. Proximal left radius and ulna appear intact with mild degenerative ossific fragment at the radial aspect of the elbow. No elbow joint effusion identified. Distal left humerus appears intact. IMPRESSION: 1. Comminuted and mildly impacted but otherwise nondisplaced distal right radius fracture. Radiocarpal joint involvement is suspected. 2. No other acute fracture or dislocation identified at the visible left wrist. No other left forearm fracture. Electronically Signed   By: Odessa Fleming M.D.   On: 02/05/2017 11:35    ____________________________________________   PROCEDURES  Procedure(s) performed: None  Procedures  Critical Care performed: No  ____________________________________________   INITIAL IMPRESSION / ASSESSMENT AND PLAN / ED COURSE  Pertinent labs & imaging results that were available during my care of the patient were reviewed by me and considered in my medical decision making (see chart for details).  Patient was placed in an OCL sugar tong splint and sling. Patient is aware that she is to elevate and use ice to decrease swelling which will help with pain. Patient states that she can take Tylenol No. 3 without any difficulties but has had bad experiences with other pain medication. Patient was given a note to return to work with restrictions to give to her supervisor. She is to call Dr. Samuel Germany office tomorrow to make an appointment for further treatment of her fractured wrist.    ____________________________________________   FINAL CLINICAL IMPRESSION(S) / ED DIAGNOSES  Final diagnoses:  Other closed fracture of distal end of left radius, initial encounter      NEW MEDICATIONS STARTED DURING  THIS VISIT:  Discharge Medication List as of 02/05/2017 12:33 PM    START taking these medications   Details  acetaminophen-codeine (TYLENOL #3) 300-30 MG tablet Take 1 tablet by mouth every 4 (four) hours as needed for moderate pain., Starting Sun 02/05/2017, Print         Note:  This document was prepared using Dragon voice recognition software and may include unintentional dictation errors.    Tommi Rumps, PA-C 02/05/17 1329    Governor Rooks, MD 02/05/17 [phone]

## 2017-02-05 NOTE — ED Triage Notes (Signed)
Patient presents to the ED for left forearm pain post fall today.  Patient is in no obvious distress.  Patient states she tripped and fell.

## 2017-11-02 IMAGING — DX DG FOOT COMPLETE 3+V*L*
3 series · 3 of 3 positions shown · non-contrast
Comparison: 09/30/2007.

CLINICAL DATA: Left foot pain for several weeks.

EXAM:
LEFT FOOT - COMPLETE 3+ VIEW

[foot ap]
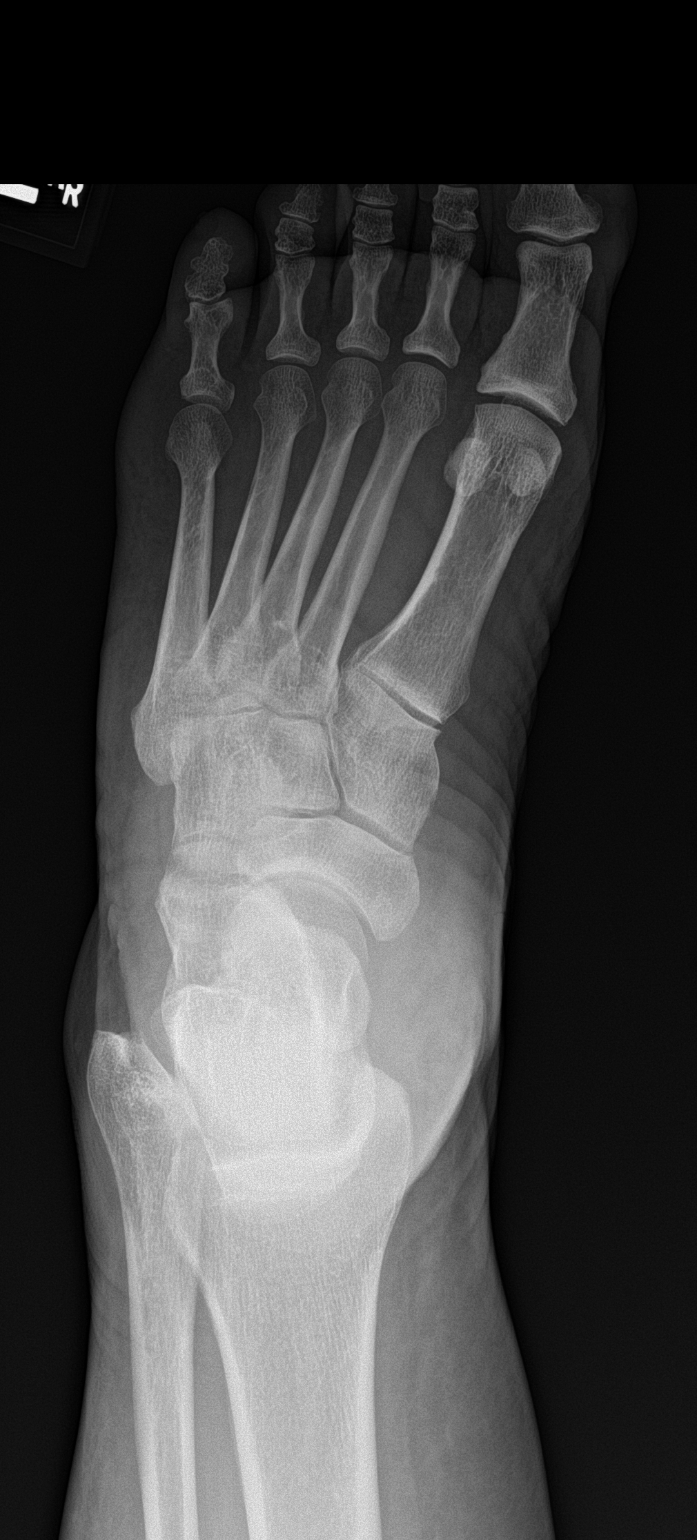

[foot obl]
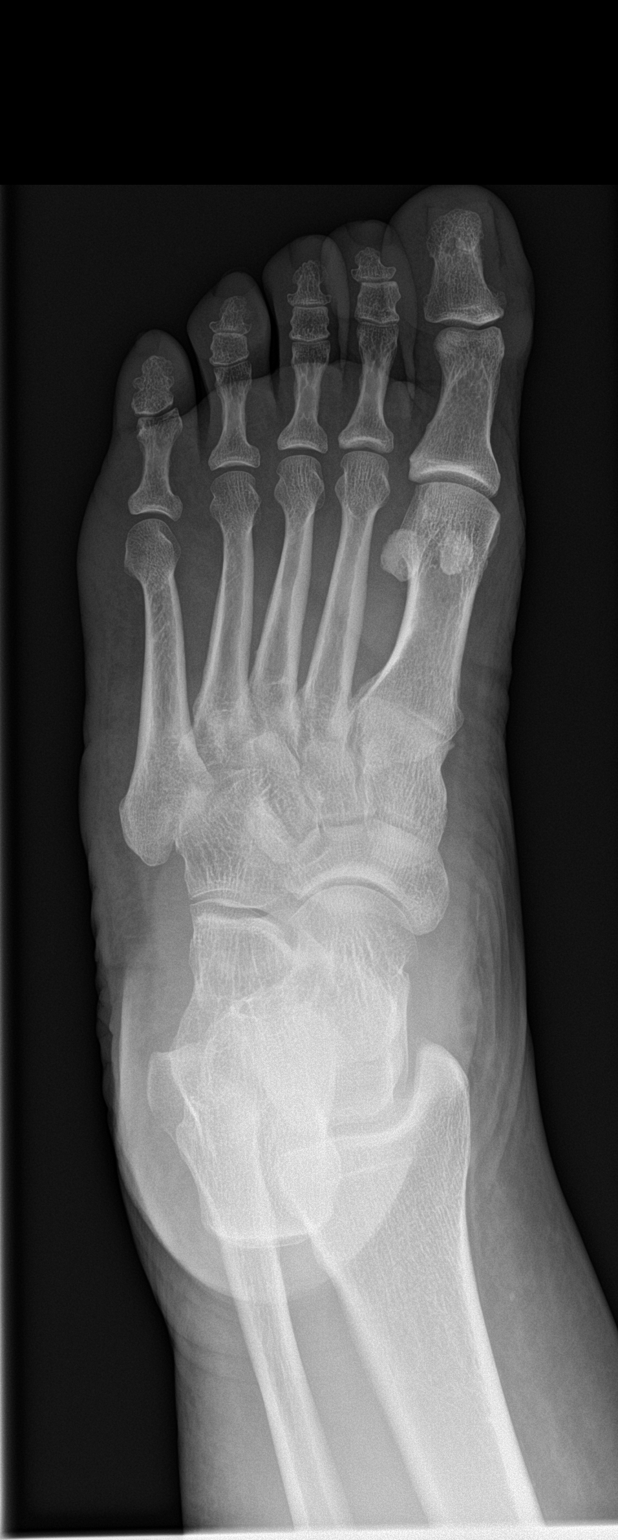

[foot lat]
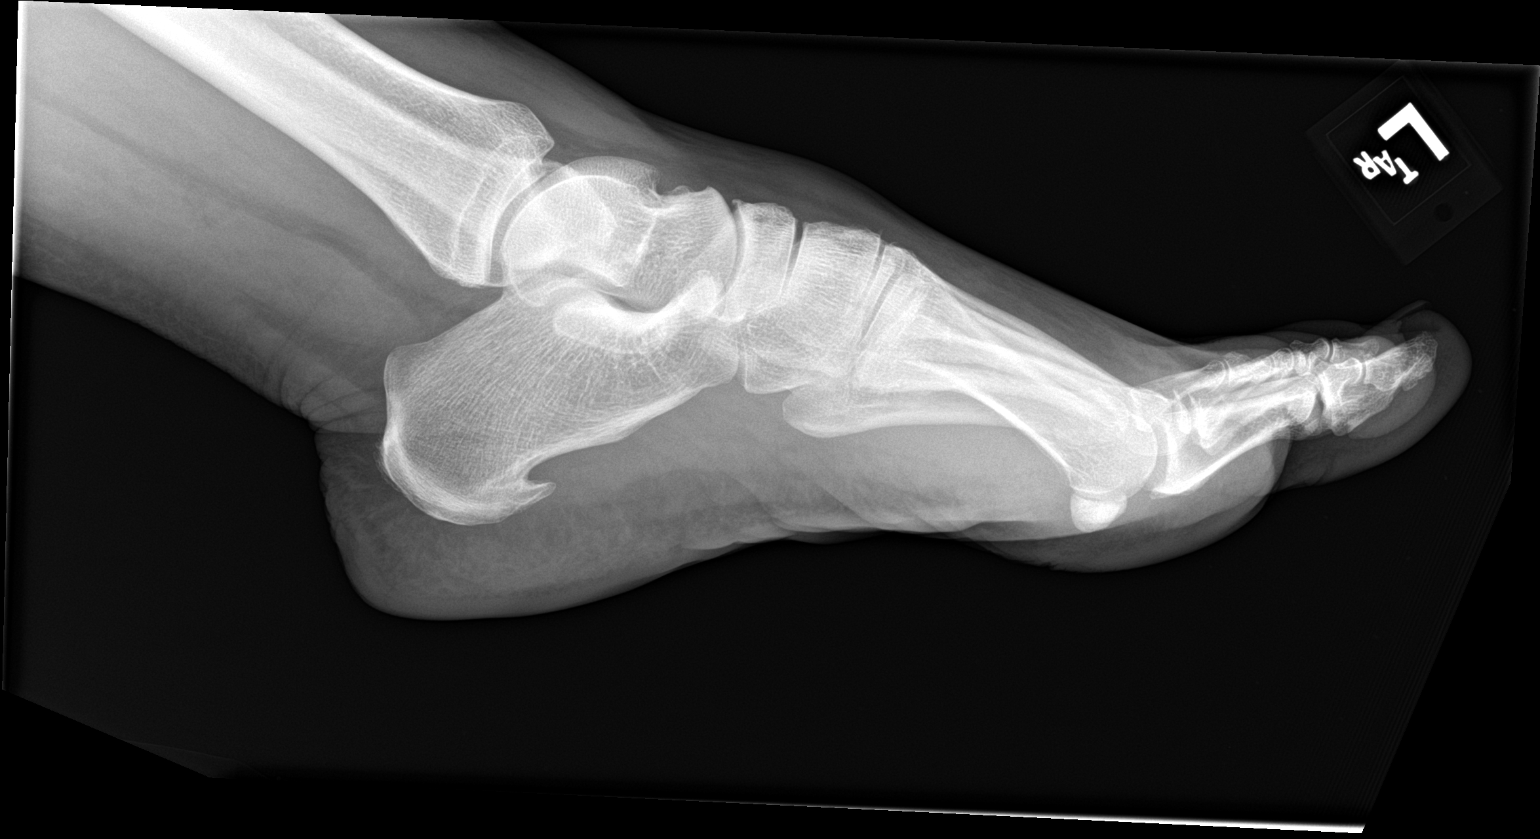

[3 of 3 positions shown; findings below may reference images not displayed]

FINDINGS: There is no evidence of fracture or dislocation. There is no
evidence of arthropathy or other focal bone abnormality. Soft
tissues are unremarkable.
IMPRESSION: Negative.

## 2018-01-02 IMAGING — CR DG FOREARM 2V*L*
2 series · 2 of 2 positions shown · non-contrast
Comparison: None.

CLINICAL DATA: 46-year-old female status post fall down [place] with
left forearm pain and swelling.

EXAM:
LEFT FOREARM - 2 VIEW

[forearm ap]
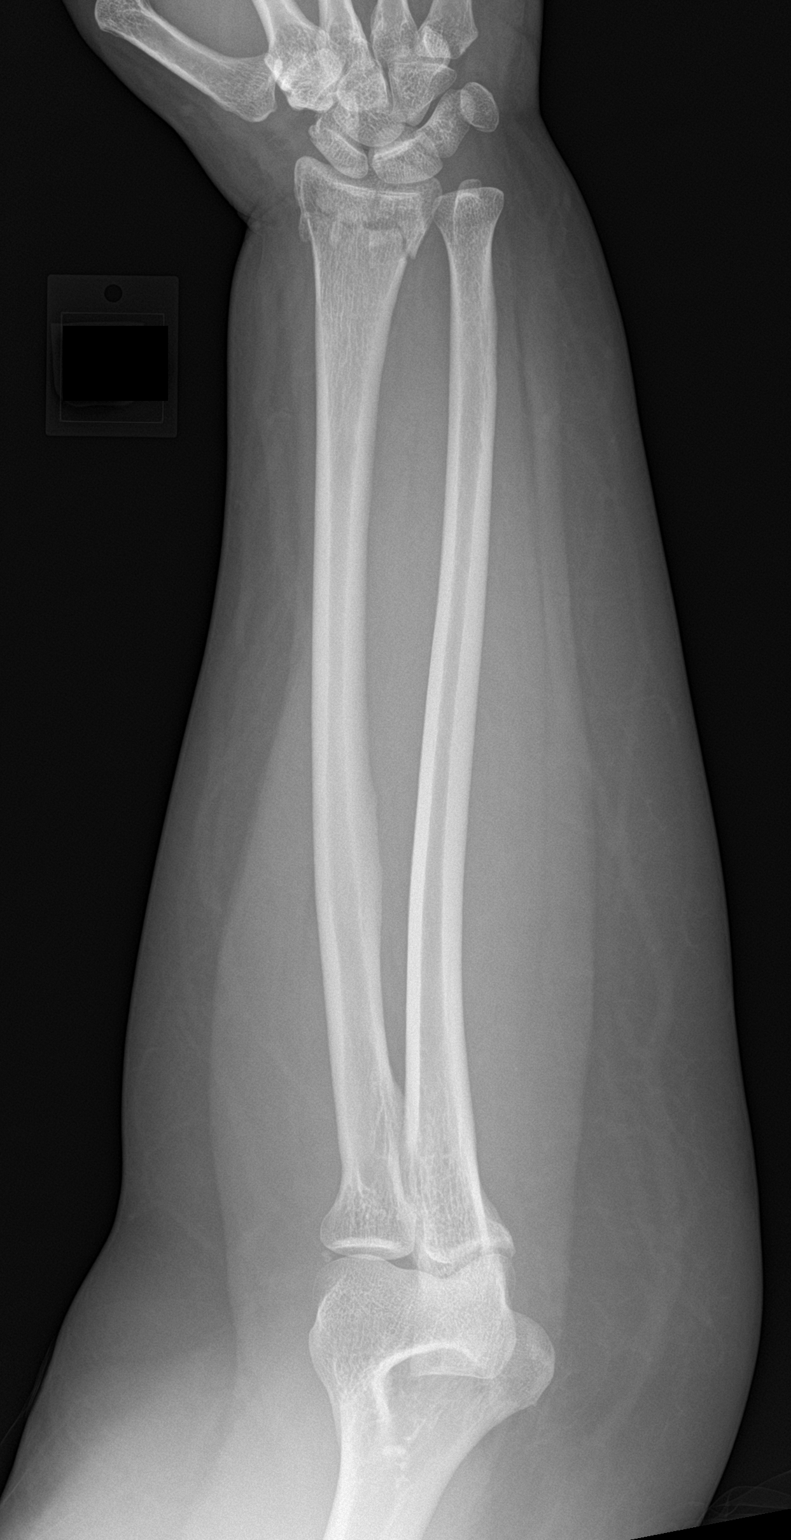

[forearm lat]
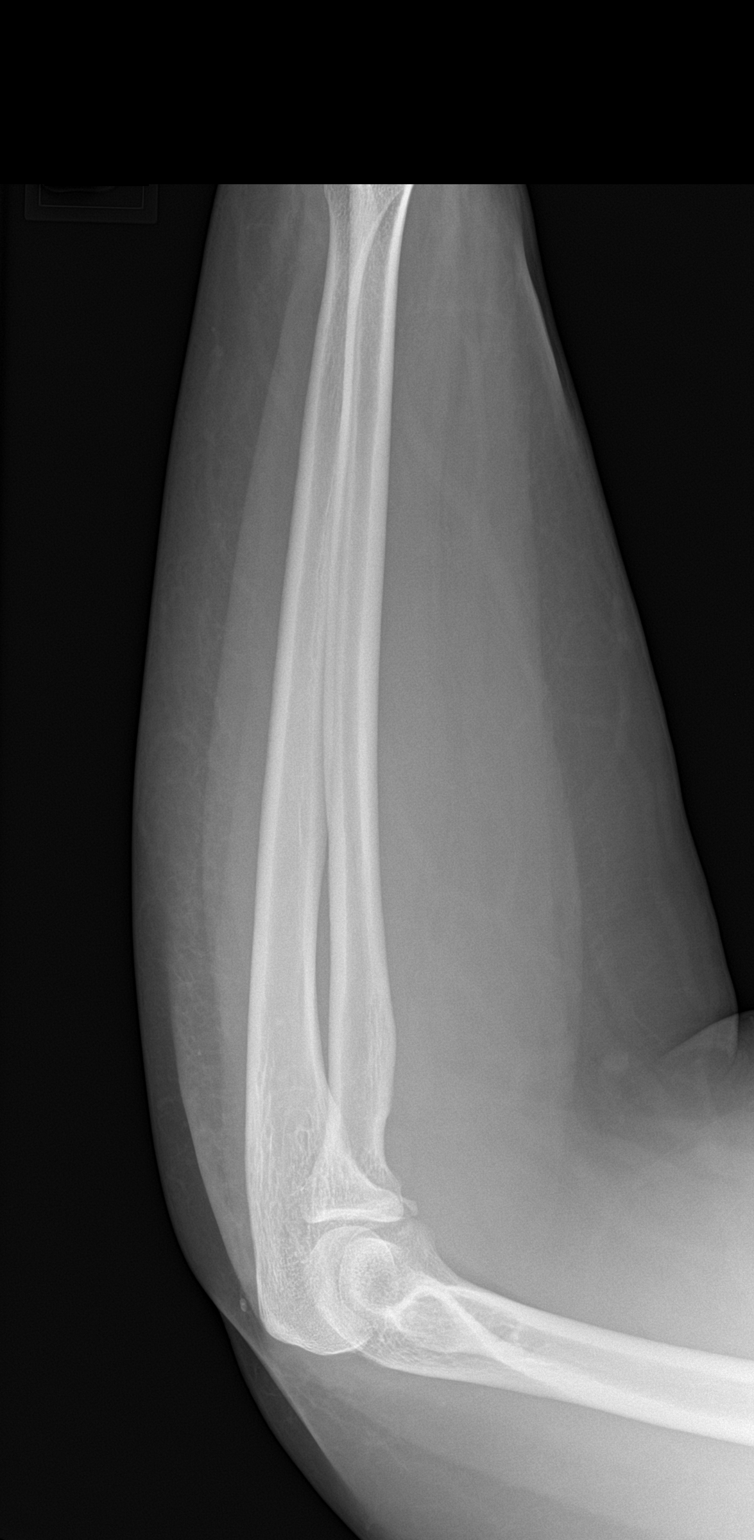

[2 of 2 positions shown; findings below may reference images not displayed]

FINDINGS: Comminuted and mildly impacted distal right radius fracture. No
significant dorsal angulation, but there is suspected radiocarpal
joint involvement. Lesser MOVIL involvement. The distal left ulna
appears intact. Carpal bone alignment appears preserved.

Underlying normal bone mineralization. Left radius and ulna shafts
are intact. Proximal left radius and ulna appear intact with mild
degenerative ossific fragment at the radial aspect of the elbow. No
elbow joint effusion identified. Distal left humerus appears intact.
IMPRESSION: 1. Comminuted and mildly impacted but otherwise nondisplaced distal
right radius fracture. Radiocarpal joint involvement is suspected.
2. No other acute fracture or dislocation identified at the visible
left wrist.. No other left forearm fracture.

## 2022-10-09 ENCOUNTER — Emergency Department
Admission: EM | Admit: 2022-10-09 | Discharge: 2022-10-09 | Disposition: A | Discharge status: Home / Self Care | Payer: Self-pay | Attending: Emergency Medicine | Admitting: Emergency Medicine

## 2022-10-09 ENCOUNTER — Other Ambulatory Visit: Payer: Self-pay

## 2022-10-09 ENCOUNTER — Emergency Department: Payer: Self-pay

## 2022-10-09 DIAGNOSIS — S0990XA Unspecified injury of head, initial encounter: Secondary | ICD-10-CM | POA: Insufficient documentation

## 2022-10-09 DIAGNOSIS — W182XXA Fall in (into) shower or empty bathtub, initial encounter: Secondary | ICD-10-CM | POA: Insufficient documentation

## 2022-10-09 DIAGNOSIS — K047 Periapical abscess without sinus: Secondary | ICD-10-CM | POA: Insufficient documentation

## 2022-10-09 DIAGNOSIS — W19XXXA Unspecified fall, initial encounter: Secondary | ICD-10-CM

## 2022-10-09 DIAGNOSIS — R220 Localized swelling, mass and lump, head: Secondary | ICD-10-CM

## 2022-10-09 MED ORDER — AMOXICILLIN-POT CLAVULANATE 875-125 MG PO TABS
1.0000 | ORAL_TABLET | Freq: Once | ORAL | Status: AC
  Administered 2022-10-09: 1 via ORAL
  Filled 2022-10-09: qty 1

## 2022-10-09 MED ORDER — AMOXICILLIN-POT CLAVULANATE 875-125 MG PO TABS: 1.0000 | ORAL_TABLET | Freq: Two times a day (BID) | ORAL | 0 refills | Status: AC

## 2022-10-09 NOTE — ED Triage Notes (Signed)
Pt presents to ED with c/o of falling out of the shower this morning. Pt denies LOC. Pt does endorse hitting her R jaw/chin area, area is swollen.

## 2022-10-09 NOTE — Discharge Instructions (Signed)
Please take Tylenol as needed for pain.  Take antibiotics as prescribed.  Return to the ER for any worsening symptoms or any urgent changes in your health recommend follow-up with dental clinic

## 2022-10-09 NOTE — ED Provider Notes (Signed)
[facility] Provider Note   CSN: [id number] Arrival date & time: 10/09/22  0740     History  Chief Complaint  Patient presents with   Lytle Michaels    Brooke Bishop is a 52 y.o. female.  With no past medical history presents to the emergency department for evaluation of a fall.  Patient states 5 days ago she fell in the shower, she slipped and hit her right jaw and face.  She denies any headache, LOC, nausea or vomiting.  She complains of facial swelling and a knot that developed.  She denies any fevers, difficulty swallowing.  She has some pain with opening her jaw.  She denies any photophobia, dizziness, lightheadedness, weakness.  She denies being on any blood thinners.  HPI     Home Medications Prior to Admission medications   Medication Sig Start Date End Date Taking? Authorizing Provider  amoxicillin-clavulanate (AUGMENTIN) 875-125 MG tablet Take 1 tablet by mouth 2 (two) times daily for 7 days. 10/09/22 10/16/22 Yes Duanne Guess, PA-C  acetaminophen-codeine (TYLENOL #3) 300-30 MG tablet Take 1 tablet by mouth every 4 (four) hours as needed for moderate pain. 02/05/17   Johnn Hai, PA-C      Allergies    Shellfish allergy and Percocet [oxycodone-acetaminophen]    Review of Systems   Review of Systems  Physical Exam Updated Vital Signs BP (!) 149/67 (BP Location: Left Arm)   Pulse 95   Temp 97.7 F (36.5 C) (Oral)   Resp 16   SpO2 96%  Physical Exam Constitutional:      Appearance: She is well-developed.  HENT:     Head: Normocephalic.     Comments: Mild swelling right upper mandible with focal soft tissue swelling, mild tenderness to palpation.  No warmth or redness.  Intraoral evaluation shows diffuse dental decay with no abscess formation, no noticeable cracked teeth.    Right Ear: External ear normal.     Left Ear: External ear normal.  Eyes:     Conjunctiva/sclera: Conjunctivae normal.  Cardiovascular:     Rate  and Rhythm: Normal rate.  Pulmonary:     Effort: Pulmonary effort is normal. No respiratory distress.     Breath sounds: No wheezing or rales.  Abdominal:     General: There is no distension.     Tenderness: There is no abdominal tenderness. There is no guarding.  Musculoskeletal:        General: Normal range of motion.     Cervical back: Normal range of motion.  Skin:    General: Skin is warm.     Findings: No rash.  Neurological:     General: No focal deficit present.     Mental Status: She is alert and oriented to person, place, and time. Mental status is at baseline.     Cranial Nerves: No cranial nerve deficit.     Sensory: No sensory deficit.     Motor: No weakness.     Coordination: Coordination normal.     Gait: Gait normal.  Psychiatric:        Mood and Affect: Mood normal.        Behavior: Behavior normal.        Thought Content: Thought content normal.     ED Results / Procedures / Treatments   Labs (all labs ordered are listed, but only abnormal results are displayed) Labs Reviewed - No data to display  EKG None  Radiology CT Head Wo  Contrast  Result Date: 10/09/2022 CLINICAL DATA:  52 year old female with head and facial trauma from fall. Initial encounter. EXAM: CT HEAD WITHOUT CONTRAST CT MAXILLOFACIAL WITHOUT CONTRAST TECHNIQUE: Multidetector CT imaging of the head and maxillofacial structures were performed using the standard protocol without intravenous contrast. Multiplanar CT image reconstructions of the maxillofacial structures were also generated. RADIATION DOSE REDUCTION: This exam was performed according to the departmental dose-optimization program which includes automated exposure control, adjustment of the mA and/or kV according to patient size and/or use of iterative reconstruction technique. COMPARISON:  12/21/2006 head CT FINDINGS: CT HEAD FINDINGS Brain: No evidence of acute infarction, hemorrhage, hydrocephalus, extra-axial collection or mass  lesion/mass effect. Vascular: No hyperdense vessel or unexpected calcification. Skull: Normal. Negative for fracture or focal lesion. Other: None. CT MAXILLOFACIAL FINDINGS Osseous: No fracture or mandibular dislocation. No destructive process. Orbits: Negative. No traumatic or inflammatory finding. Sinuses: Mucosal thickening within the RIGHT maxillary sinus and anterior RIGHT ethmoid air cells noted. No air-fluid levels. Soft tissues: Soft tissue swelling overlying the chin identified. IMPRESSION: 1. No evidence of acute intracranial abnormality. 2. Soft tissue swelling overlying the chin without acute facial fracture. 3. Mild chronic paranasal sinus disease. Electronically Signed   By: Margarette Canada M.D.   On: 10/09/2022 08:52   CT Maxillofacial Wo Contrast  Result Date: 10/09/2022 CLINICAL DATA:  52 year old female with head and facial trauma from fall. Initial encounter. EXAM: CT HEAD WITHOUT CONTRAST CT MAXILLOFACIAL WITHOUT CONTRAST TECHNIQUE: Multidetector CT imaging of the head and maxillofacial structures were performed using the standard protocol without intravenous contrast. Multiplanar CT image reconstructions of the maxillofacial structures were also generated. RADIATION DOSE REDUCTION: This exam was performed according to the departmental dose-optimization program which includes automated exposure control, adjustment of the mA and/or kV according to patient size and/or use of iterative reconstruction technique. COMPARISON:  12/21/2006 head CT FINDINGS: CT HEAD FINDINGS Brain: No evidence of acute infarction, hemorrhage, hydrocephalus, extra-axial collection or mass lesion/mass effect. Vascular: No hyperdense vessel or unexpected calcification. Skull: Normal. Negative for fracture or focal lesion. Other: None. CT MAXILLOFACIAL FINDINGS Osseous: No fracture or mandibular dislocation. No destructive process. Orbits: Negative. No traumatic or inflammatory finding. Sinuses: Mucosal thickening within the  RIGHT maxillary sinus and anterior RIGHT ethmoid air cells noted. No air-fluid levels. Soft tissues: Soft tissue swelling overlying the chin identified. IMPRESSION: 1. No evidence of acute intracranial abnormality. 2. Soft tissue swelling overlying the chin without acute facial fracture. 3. Mild chronic paranasal sinus disease. Electronically Signed   By: Margarette Canada M.D.   On: 10/09/2022 08:52    Procedures Procedures    Medications Ordered in ED Medications  amoxicillin-clavulanate (AUGMENTIN) 875-125 MG per tablet 1 tablet (has no administration in time range)    ED Course/ Medical Decision Making/ A&P                             Medical Decision Making Amount and/or Complexity of Data Reviewed Radiology: ordered.   52 year old female with fall and injury several days ago.  She developed some right sided facial swelling, no fevers.  She denies any LOC headache nausea or vomiting.  No weakness or neurological deficits.  CT of the head and maxillofacial obtained to rule out for any acute intracranial process or maxillofacial fractures.  CT scans reviewed by me are negative.  Patient noted to have significant dental decay/caries.  Soft tissue swelling could likely be dental infection so will place  on Augmentin.  She will continue with Tylenol and she understands signs and symptoms return to the ER for.  Recommend follow-up with dental clinic Final Clinical Impression(s) / ED Diagnoses Final diagnoses:  Fall, initial encounter  Injury of head, initial encounter  Right facial swelling  Dental infection    Rx / DC Orders ED Discharge Orders          Ordered    amoxicillin-clavulanate (AUGMENTIN) 875-125 MG tablet  2 times daily        10/09/22 0905              Duanne Guess, PA-C 10/09/22 [id number]    Carrie Mew, MD 10/10/22 1843

## 2023-06-25 ENCOUNTER — Emergency Department
Admission: EM | Admit: 2023-06-25 | Discharge: 2023-06-25 | Disposition: A | Discharge status: Home / Self Care | Payer: Self-pay | Attending: Emergency Medicine | Admitting: Emergency Medicine

## 2023-06-25 ENCOUNTER — Emergency Department: Payer: Self-pay

## 2023-06-25 ENCOUNTER — Other Ambulatory Visit: Payer: Self-pay

## 2023-06-25 DIAGNOSIS — M25532 Pain in left wrist: Secondary | ICD-10-CM

## 2023-06-25 DIAGNOSIS — G5602 Carpal tunnel syndrome, left upper limb: Secondary | ICD-10-CM | POA: Insufficient documentation

## 2023-06-25 MED ORDER — MELOXICAM 15 MG PO TABS: 15.0000 mg | ORAL_TABLET | Freq: Every day | ORAL | 2 refills | Status: AC

## 2023-06-25 MED ORDER — MELOXICAM 7.5 MG PO TABS
15.0000 mg | ORAL_TABLET | Freq: Once | ORAL | Status: AC
  Administered 2023-06-25: 15 mg via ORAL
  Filled 2023-06-25: qty 2

## 2023-06-25 MED ORDER — METAXALONE 800 MG PO TABS: 800.0000 mg | ORAL_TABLET | Freq: Three times a day (TID) | ORAL | 1 refills | Status: AC

## 2023-06-25 NOTE — ED Triage Notes (Signed)
Pt sts that she has been having left wrist pain for the last four days.

## 2023-06-25 NOTE — ED Notes (Signed)
Patient declined discharge vital signs. 

## 2023-06-25 NOTE — Discharge Instructions (Addendum)
Your exam is consistent with likely wrist sprain.  You may also have some aggravation of your carpal tunnel.  Your x-ray is negative for any acute fracture or dislocation, but does show some mild osteoarthritis.  Take the prescription meds as directed.  Wear the wrist brace as needed for support.  Consider follow-up with [facility] hand specialist for reevaluation.

## 2023-06-26 NOTE — ED Provider Notes (Cosign Needed)
[facility] Emergency Department Provider Note     Event Date/Time   First MD Initiated Contact with Patient 06/25/23 1547     (approximate)   History   Wrist Pain   HPI  Brooke Bishop is a 52 y.o. female with a history of remote wrist fracture status post ORIF with revision, and carpal tunnel syndrome, presents to the ED for left wrist pain.  Patient report 4 days of intermittent symptoms.  She reports paresthesias to the last 3 digits as well as some pain and inflammation.  She also reports some pain with movement.  She denies any recent injury, trauma, fall.   Physical Exam   Triage Vital Signs: ED Triage Vitals  Encounter Vitals Group     BP 06/25/23 1527 (!) 145/85     Systolic BP Percentile --      Diastolic BP Percentile --      Pulse Rate 06/25/23 1527 93     Resp 06/25/23 1527 18     Temp 06/25/23 1527 98.7 F (37.1 C)     Temp Source 06/25/23 1527 Oral     SpO2 06/25/23 1527 100 %     Weight 06/25/23 1526 265 lb (120.2 kg)     Height 06/25/23 1526 5\' 5"  (1.651 m)     Head Circumference --      Peak Flow --      Pain Score 06/25/23 1526 10     Pain Loc --      Pain Education --      Exclude from Growth Chart --     Most recent vital signs: Vitals:   06/25/23 1527  BP: (!) 145/85  Pulse: 93  Resp: 18  Temp: 98.7 F (37.1 C)  SpO2: 100%    General Awake, no distress.  CV:  Good peripheral perfusion. No CCE distally RESP:  Normal effort.  ABD:  No distention.  MSK:  Left hand and wrist without obvious deformity or dislocation.  Normal composite fist noted.   NEURO: Normal interest and opposition testing noted.  Positive carpal Tinel's.  Negative cubital Tinel's.   ED Results / Procedures / Treatments   Labs (all labs ordered are listed, but only abnormal results are displayed) Labs Reviewed - No data to display   EKG   RADIOLOGY  I personally viewed and evaluated these images as part of my medical decision  making, as well as reviewing the written report by the radiologist.  ED Provider Interpretation: No acute findings  DG Left Wrist  IMPRESSION:  1. Prior healed distal left radial fracture with residual dorsal angulation. 2. No acute bony abnormality. 3. Osteoarthritis.  PROCEDURES:  Critical Care performed: No  Procedures   MEDICATIONS ORDERED IN ED: Medications  meloxicam (MOBIC) tablet 15 mg (15 mg Oral Given 06/25/23 1659)     IMPRESSION / MDM / ASSESSMENT AND PLAN / ED COURSE  I reviewed the triage vital signs and the nursing notes.                              Differential diagnosis includes, but is not limited to, carpal tunnel, wrist sprain, wrist tendinitis, tenosynovitis, OA  Patient's presentation is most consistent with acute, uncomplicated illness.  Patient's diagnosis is consistent with left wrist pain and carpal tunnel syndrome.  No evidence of any acute intra articular fracture tendinitis based on interpretation of images.  Reassuring exam at this time  without evidence of acute neuromuscular deficit.  Patient will be discharged home with prescriptions for meloxicam and metaxalone.  Patient is placed in a wrist cock-up splint for support.  Patient is to follow up with [facility] hand specialist as discussed, as needed or otherwise directed. Patient is given ED precautions to return to the ED for any worsening or new symptoms.     FINAL CLINICAL IMPRESSION(S) / ED DIAGNOSES   Final diagnoses:  Left wrist pain  Carpal tunnel syndrome of left wrist     Rx / DC Orders   ED Discharge Orders          Ordered    meloxicam (MOBIC) 15 MG tablet  Daily        06/25/23 1645    metaxalone (SKELAXIN) 800 MG tablet  3 times daily        06/25/23 1645             Note:  This document was prepared using Dragon voice recognition software and may include unintentional dictation errors.    Lissa Hoard, PA-C 06/26/23 2356    Lissa Hoard, PA-C 06/27/23 0015    Minna Antis, MD 06/27/23 2307
